# Patient Record
Sex: Female | Born: 1955 | Race: White | Hispanic: No | Marital: Married
Health system: Southern US, Community
[De-identification: ages and names within clinical notes are randomized; demographics above are authoritative.]

## PROBLEM LIST (undated history)

## (undated) DIAGNOSIS — L570 Actinic keratosis: Secondary | ICD-10-CM

## (undated) DIAGNOSIS — I10 Essential (primary) hypertension: Secondary | ICD-10-CM

## (undated) HISTORY — DX: Actinic keratosis: L57.0

---

## 2004-07-05 ENCOUNTER — Ambulatory Visit: Payer: Self-pay | Admitting: Unknown Physician Specialty

## 2005-05-20 ENCOUNTER — Ambulatory Visit: Payer: Self-pay | Admitting: Family Medicine

## 2005-06-18 ENCOUNTER — Ambulatory Visit: Payer: Self-pay | Admitting: Psychiatry

## 2005-07-18 ENCOUNTER — Ambulatory Visit: Payer: Self-pay | Admitting: Unknown Physician Specialty

## 2006-03-20 DIAGNOSIS — Z86018 Personal history of other benign neoplasm: Secondary | ICD-10-CM

## 2006-03-20 HISTORY — DX: Personal history of other benign neoplasm: Z86.018

## 2006-07-22 ENCOUNTER — Ambulatory Visit: Payer: Self-pay | Admitting: Unknown Physician Specialty

## 2007-07-27 ENCOUNTER — Ambulatory Visit: Payer: Self-pay | Admitting: Unknown Physician Specialty

## 2007-07-29 ENCOUNTER — Ambulatory Visit: Payer: Self-pay | Admitting: Unknown Physician Specialty

## 2008-08-09 ENCOUNTER — Ambulatory Visit: Payer: Self-pay | Admitting: Unknown Physician Specialty

## 2009-08-15 ENCOUNTER — Ambulatory Visit: Payer: Self-pay | Admitting: Unknown Physician Specialty

## 2010-09-20 ENCOUNTER — Ambulatory Visit: Payer: Self-pay | Admitting: Unknown Physician Specialty

## 2010-10-11 ENCOUNTER — Ambulatory Visit: Payer: Self-pay | Admitting: Unknown Physician Specialty

## 2011-12-03 ENCOUNTER — Ambulatory Visit: Payer: Self-pay | Admitting: Obstetrics and Gynecology

## 2013-01-04 ENCOUNTER — Ambulatory Visit: Payer: Self-pay | Admitting: Obstetrics and Gynecology

## 2013-01-18 ENCOUNTER — Ambulatory Visit: Payer: Self-pay | Admitting: Gastroenterology

## 2013-05-27 ENCOUNTER — Ambulatory Visit: Payer: Self-pay | Admitting: Obstetrics and Gynecology

## 2013-05-27 LAB — BASIC METABOLIC PANEL
Anion Gap: 3 — ABNORMAL LOW (ref 7–16)
BUN: 11 mg/dL (ref 7–18)
CALCIUM: 9.7 mg/dL (ref 8.5–10.1)
CHLORIDE: 102 mmol/L (ref 98–107)
Co2: 32 mmol/L (ref 21–32)
Creatinine: 0.7 mg/dL (ref 0.60–1.30)
EGFR (Non-African Amer.): >60
Glucose: 92 mg/dL (ref 65–99)
Osmolality: 273 (ref 275–301)
Potassium: 3.8 mmol/L (ref 3.5–5.1)
SODIUM: 137 mmol/L (ref 136–145)

## 2013-06-04 ENCOUNTER — Ambulatory Visit: Payer: Self-pay | Admitting: Obstetrics and Gynecology

## 2013-06-07 LAB — PATHOLOGY REPORT

## 2014-03-31 ENCOUNTER — Ambulatory Visit: Payer: Self-pay | Admitting: Obstetrics and Gynecology

## 2014-08-06 NOTE — Op Note (Signed)
PATIENT NAME:  Danielle Wang, Danielle Wang MR#:  [id number] DATE OF BIRTH:  1955-07-07  DATE OF PROCEDURE:  06/04/2013  PREOPERATIVE DIAGNOSIS: Endometrial polyp with severe bleeding.   POSTOPERATIVE DIAGNOSIS: Endometrial polyp with severe bleeding.   PROCEDURE: Hysteroscopy, dilatation and curettage, polypectomy.  ESTIMATED BLOOD LOSS: 25 mL.   FINDINGS: Very thickened endometrium with polyp outside the left tubal ostia, broad base, with a fluffy polyp on a stalk anteriorly.   SURGEON: Delsa Sale, MD  DESCRIPTION OF PROCEDURE: The patient was taken to the operating room and placed in supine position. After adequate general endotracheal anesthesia was instilled, the patient was prepped and draped in the usual sterile fashion. A side-opening speculum was placed in the patient's vagina, and the anterior lip of the cervix was grasped with a single-tooth tenaculum. The uterus was sounded and dilated to accommodate the MyoSure. The camera was placed inside, and the aforementioned findings were seen. Photographs were taken. The MyoSure was then placed into the cavity, and the polyps were removed with intermittent release of the MyoSure. D and C was then performed. Afterwards, good hemostasis was identified. The MyoSure was then removed, and the uterus was allowed to drain. The single-tooth tenaculum was removed from the anterior lip of the cervix. The side-opening speculum was removed. Good hemostasis was identified, and the patient was taken to recovery after having tolerated the procedure well.   ____________________________ Delsa Sale, MD cck:lb D: 06/12/2013 05:25:49 ET T: 06/12/2013 05:53:24 ET JOB#: [id number]  cc: Delsa Sale, MD, <Dictator> Delsa Sale MD ELECTRONICALLY SIGNED 06/14/2013 11:26

## 2015-04-13 ENCOUNTER — Other Ambulatory Visit: Payer: Self-pay | Admitting: Obstetrics and Gynecology

## 2015-04-13 DIAGNOSIS — Z1231 Encounter for screening mammogram for malignant neoplasm of breast: Secondary | ICD-10-CM

## 2015-04-21 ENCOUNTER — Ambulatory Visit
Admission: RE | Admit: 2015-04-21 | Discharge: 2015-04-21 | Disposition: A | Discharge status: Home / Self Care | Payer: 59 | Source: Ambulatory Visit | Attending: Obstetrics and Gynecology | Admitting: Obstetrics and Gynecology

## 2015-04-21 DIAGNOSIS — Z1231 Encounter for screening mammogram for malignant neoplasm of breast: Secondary | ICD-10-CM | POA: Diagnosis not present

## 2015-06-19 ENCOUNTER — Other Ambulatory Visit: Payer: Self-pay | Admitting: Family Medicine

## 2015-06-19 DIAGNOSIS — R109 Unspecified abdominal pain: Secondary | ICD-10-CM

## 2015-06-27 ENCOUNTER — Ambulatory Visit
Admission: RE | Admit: 2015-06-27 | Discharge: 2015-06-27 | Disposition: A | Discharge status: Home / Self Care | Payer: 59 | Source: Ambulatory Visit | Attending: Family Medicine | Admitting: Family Medicine

## 2015-06-27 DIAGNOSIS — I7090 Unspecified atherosclerosis: Secondary | ICD-10-CM | POA: Insufficient documentation

## 2015-06-27 DIAGNOSIS — K8020 Calculus of gallbladder without cholecystitis without obstruction: Secondary | ICD-10-CM | POA: Insufficient documentation

## 2015-06-27 DIAGNOSIS — K5730 Diverticulosis of large intestine without perforation or abscess without bleeding: Secondary | ICD-10-CM | POA: Diagnosis not present

## 2015-06-27 DIAGNOSIS — R109 Unspecified abdominal pain: Secondary | ICD-10-CM

## 2015-06-27 HISTORY — DX: Essential (primary) hypertension: I10

## 2015-06-27 MED ORDER — IOHEXOL 300 MG/ML  SOLN
100.0000 mL | Freq: Once | INTRAMUSCULAR | Status: AC | PRN
  Administered 2015-06-27: 100 mL via INTRAVENOUS

## 2016-03-25 ENCOUNTER — Other Ambulatory Visit: Payer: Self-pay | Admitting: Obstetrics & Gynecology

## 2016-03-25 DIAGNOSIS — Z1231 Encounter for screening mammogram for malignant neoplasm of breast: Secondary | ICD-10-CM

## 2016-04-24 ENCOUNTER — Ambulatory Visit
Admission: RE | Admit: 2016-04-24 | Discharge: 2016-04-24 | Disposition: A | Discharge status: Home / Self Care | Payer: 59 | Source: Ambulatory Visit | Attending: Obstetrics & Gynecology | Admitting: Obstetrics & Gynecology

## 2016-04-24 DIAGNOSIS — Z1231 Encounter for screening mammogram for malignant neoplasm of breast: Secondary | ICD-10-CM | POA: Diagnosis present

## 2017-06-12 ENCOUNTER — Other Ambulatory Visit: Payer: Self-pay | Admitting: Obstetrics & Gynecology

## 2017-06-12 DIAGNOSIS — Z1231 Encounter for screening mammogram for malignant neoplasm of breast: Secondary | ICD-10-CM

## 2017-07-02 ENCOUNTER — Ambulatory Visit
Admission: RE | Admit: 2017-07-02 | Discharge: 2017-07-02 | Disposition: A | Discharge status: Home / Self Care | Payer: Managed Care, Other (non HMO) | Source: Ambulatory Visit | Attending: Obstetrics & Gynecology | Admitting: Obstetrics & Gynecology

## 2017-07-02 DIAGNOSIS — Z1231 Encounter for screening mammogram for malignant neoplasm of breast: Secondary | ICD-10-CM | POA: Diagnosis present

## 2018-06-25 ENCOUNTER — Other Ambulatory Visit: Payer: Self-pay | Admitting: Obstetrics & Gynecology

## 2018-06-25 DIAGNOSIS — Z1231 Encounter for screening mammogram for malignant neoplasm of breast: Secondary | ICD-10-CM

## 2018-09-30 ENCOUNTER — Ambulatory Visit
Admission: RE | Admit: 2018-09-30 | Discharge: 2018-09-30 | Disposition: A | Discharge status: Home / Self Care | Payer: Managed Care, Other (non HMO) | Source: Ambulatory Visit | Attending: Obstetrics & Gynecology | Admitting: Obstetrics & Gynecology

## 2018-09-30 ENCOUNTER — Other Ambulatory Visit: Payer: Self-pay

## 2018-09-30 DIAGNOSIS — Z1231 Encounter for screening mammogram for malignant neoplasm of breast: Secondary | ICD-10-CM | POA: Diagnosis not present

## 2019-07-06 ENCOUNTER — Other Ambulatory Visit: Payer: Self-pay | Admitting: Obstetrics & Gynecology

## 2019-07-06 DIAGNOSIS — Z1231 Encounter for screening mammogram for malignant neoplasm of breast: Secondary | ICD-10-CM

## 2019-07-23 ENCOUNTER — Ambulatory Visit: Payer: Managed Care, Other (non HMO) | Attending: Internal Medicine

## 2019-07-23 ENCOUNTER — Other Ambulatory Visit: Payer: Self-pay

## 2019-07-23 DIAGNOSIS — Z23 Encounter for immunization: Secondary | ICD-10-CM

## 2019-07-23 NOTE — Progress Notes (Signed)
   Covid-19 Vaccination Clinic  Name:  Danielle Wang    MRN: [place]:[id number] DOB: October 20, 1955  07/23/2019  [name] was observed post Covid-19 immunization for 15 minutes without incident. She was provided with Vaccine Information Sheet and instruction to access the V-Safe system.   [name] was instructed to call 911 with any severe reactions post vaccine: Marland Kitchen Difficulty breathing  . Swelling of face and throat  . A fast heartbeat  . A bad rash all over body  . Dizziness and weakness   Immunizations Administered    Name Date Dose VIS Date Route   Pfizer COVID-19 Vaccine 07/23/2019  9:35 AM 0.3 mL 03/26/2019 Intramuscular   Manufacturer: [place]   Lot: [id number]   [place]: [id number]

## 2019-08-18 ENCOUNTER — Other Ambulatory Visit: Payer: Self-pay

## 2019-08-18 ENCOUNTER — Ambulatory Visit: Payer: Managed Care, Other (non HMO) | Attending: Internal Medicine

## 2019-08-18 DIAGNOSIS — Z23 Encounter for immunization: Secondary | ICD-10-CM

## 2019-08-18 NOTE — Progress Notes (Signed)
   Covid-19 Vaccination Clinic  Name:  JAYDIE ZIEGENHAGEN    MRN: [place]:[id number] DOB: 1956-01-01  08/18/2019  Ms. Nelms was observed post Covid-19 immunization for 15 minutes without incident. She was provided with Vaccine Information Sheet and instruction to access the V-Safe system.   Ms. Elizardo was instructed to call 911 with any severe reactions post vaccine: Marland Kitchen Difficulty breathing  . Swelling of face and throat  . A fast heartbeat  . A bad rash all over body  . Dizziness and weakness   Immunizations Administered    Name Date Dose VIS Date Route   Pfizer COVID-19 Vaccine 08/18/2019  8:47 AM 0.3 mL 06/09/2018 Intramuscular   Manufacturer: [place]   Lot: [id number]   [place]: [id number]

## 2019-10-05 ENCOUNTER — Ambulatory Visit
Admission: RE | Admit: 2019-10-05 | Discharge: 2019-10-05 | Disposition: A | Discharge status: Home / Self Care | Payer: Managed Care, Other (non HMO) | Source: Ambulatory Visit | Attending: Obstetrics & Gynecology | Admitting: Obstetrics & Gynecology

## 2019-10-05 DIAGNOSIS — Z1231 Encounter for screening mammogram for malignant neoplasm of breast: Secondary | ICD-10-CM | POA: Insufficient documentation

## 2019-11-10 ENCOUNTER — Ambulatory Visit: Payer: Managed Care, Other (non HMO) | Admitting: Dermatology

## 2019-11-10 ENCOUNTER — Encounter: Payer: Self-pay | Admitting: Dermatology

## 2019-11-10 ENCOUNTER — Other Ambulatory Visit: Payer: Self-pay | Admitting: Dermatology

## 2019-11-10 ENCOUNTER — Other Ambulatory Visit: Payer: Self-pay

## 2019-11-10 DIAGNOSIS — L820 Inflamed seborrheic keratosis: Secondary | ICD-10-CM

## 2019-11-10 DIAGNOSIS — L578 Other skin changes due to chronic exposure to nonionizing radiation: Secondary | ICD-10-CM

## 2019-11-10 DIAGNOSIS — Z86018 Personal history of other benign neoplasm: Secondary | ICD-10-CM

## 2019-11-10 DIAGNOSIS — L821 Other seborrheic keratosis: Secondary | ICD-10-CM

## 2019-11-10 DIAGNOSIS — Z1283 Encounter for screening for malignant neoplasm of skin: Secondary | ICD-10-CM

## 2019-11-10 DIAGNOSIS — D492 Neoplasm of unspecified behavior of bone, soft tissue, and skin: Secondary | ICD-10-CM

## 2019-11-10 DIAGNOSIS — L814 Other melanin hyperpigmentation: Secondary | ICD-10-CM | POA: Diagnosis not present

## 2019-11-10 DIAGNOSIS — D485 Neoplasm of uncertain behavior of skin: Secondary | ICD-10-CM | POA: Diagnosis not present

## 2019-11-10 DIAGNOSIS — D229 Melanocytic nevi, unspecified: Secondary | ICD-10-CM

## 2019-11-10 DIAGNOSIS — I781 Nevus, non-neoplastic: Secondary | ICD-10-CM

## 2019-11-10 DIAGNOSIS — D1800 Hemangioma unspecified site: Secondary | ICD-10-CM

## 2019-11-10 HISTORY — DX: Personal history of other benign neoplasm: Z86.018

## 2019-11-10 NOTE — Patient Instructions (Signed)

## 2019-11-10 NOTE — Progress Notes (Signed)
Follow-Up Visit   Subjective  Danielle Wang is a 64 y.o. female who presents for the following: Annual Exam (Total body skin exam, hx of dysplastic nevus R lat braline 2007) and growths irritating (neck, back). The patient presents for Total-Body Skin Exam (TBSE) for skin cancer screening and mole check.  The following portions of the chart were reviewed this encounter and updated as appropriate:  Allergies  Meds  Problems  Med Hx  Surg Hx  Fam Hx     Review of Systems:  No other skin or systemic complaints except as noted in HPI or Assessment and Plan.  Objective  Well appearing patient in no apparent distress; mood and affect are within normal limits.  A full examination was performed including scalp, head, eyes, ears, nose, lips, neck, chest, axillae, abdomen, back, buttocks, bilateral upper extremities, bilateral lower extremities, hands, feet, fingers, toes, fingernails, and toenails. All findings within normal limits unless otherwise noted below.  Objective  Right lateral braline: Scar with no evidence of recurrence.   Objective  Left scapula: 0.7cm irregular brown macule  Objective  Left sup breast: 0.6cm irregular brown macule  Objective  Right medial breast: 0.6cm irregular brown macule  Objective  Neck, back x 8 (8): Erythematous keratotic or waxy stuck-on papule or plaque.    Assessment & Plan    Telangiectasia/Rosacea - Dilated blood vessel - Benign appearing on exam - Call for changes  Lentigines - Scattered tan macules - Discussed due to sun exposure - Benign, observe - Call for any changes - Discussed BBL, pt may schedule for treatment  Seborrheic Keratoses - Stuck-on, waxy, tan-brown papules and plaques  - Discussed benign etiology and prognosis. - Observe - Call for any changes  Melanocytic Nevi - Tan-brown and/or pink-flesh-colored symmetric macules and papules - Benign appearing on exam today - Observation - Call clinic for new  or changing moles - Recommend daily use of broad spectrum spf 30+ sunscreen to sun-exposed areas.   Hemangiomas - Red papules - Discussed benign nature - Observe - Call for any changes  Actinic Damage - diffuse scaly erythematous macules with underlying dyspigmentation - Recommend daily broad spectrum sunscreen SPF 30+ to sun-exposed areas, reapply every 2 hours as needed.  - Call for new or changing lesions.  Skin cancer screening performed today.   History of dysplastic nevus Right lateral braline  Clear, observe for changes   Neoplasm of skin (3) Left scapula  Epidermal / dermal shaving  Lesion diameter (cm):  0.7 Informed consent: discussed and consent obtained   Timeout: patient name, date of birth, surgical site, and procedure verified   Procedure prep:  Patient was prepped and draped in usual sterile fashion Prep type:  Isopropyl alcohol Anesthesia: the lesion was anesthetized in a standard fashion   Anesthetic:  1% lidocaine w/ epinephrine 1-100,000 buffered w/ 8.4% NaHCO3 Instrument used: flexible razor blade   Hemostasis achieved with: pressure, aluminum chloride and electrodesiccation   Outcome: patient tolerated procedure well   Post-procedure details: sterile dressing applied and wound care instructions given   Dressing type: bandage and petrolatum    Left sup breast  Epidermal / dermal shaving  Lesion diameter (cm):  0.6 Informed consent: discussed and consent obtained   Timeout: patient name, date of birth, surgical site, and procedure verified   Procedure prep:  Patient was prepped and draped in usual sterile fashion Prep type:  Isopropyl alcohol Anesthesia: the lesion was anesthetized in a standard fashion   Anesthetic:  1% lidocaine w/ epinephrine 1-100,000 buffered w/ 8.4% NaHCO3 Instrument used: flexible razor blade   Hemostasis achieved with: pressure, aluminum chloride and electrodesiccation   Outcome: patient tolerated procedure well     Post-procedure details: sterile dressing applied and wound care instructions given   Dressing type: bandage and petrolatum    Right medial breast  Epidermal / dermal shaving  Lesion diameter (cm):  0.6 Informed consent: discussed and consent obtained   Timeout: patient name, date of birth, surgical site, and procedure verified   Procedure prep:  Patient was prepped and draped in usual sterile fashion Prep type:  Isopropyl alcohol Anesthesia: the lesion was anesthetized in a standard fashion   Anesthetic:  1% lidocaine w/ epinephrine 1-100,000 buffered w/ 8.4% NaHCO3 Instrument used: flexible razor blade   Hemostasis achieved with: pressure, aluminum chloride and electrodesiccation   Outcome: patient tolerated procedure well   Post-procedure details: sterile dressing applied and wound care instructions given   Dressing type: bandage and petrolatum    Other Related Procedures Anatomic Pathology Report  Inflamed seborrheic keratosis (8) Neck, back x 8  Destruction of lesion - Neck, back x 8 Complexity: simple   Destruction method: cryotherapy   Informed consent: discussed and consent obtained   Timeout:  patient name, date of birth, surgical site, and procedure verified Lesion destroyed using liquid nitrogen: Yes   Region frozen until ice ball extended beyond lesion: Yes   Outcome: patient tolerated procedure well with no complications   Post-procedure details: wound care instructions given    Skin cancer screening  Return in about 1 year (around 11/09/2020) for TBSE, hx of Dysplastic Nevus.   I, Othelia Pulling, RMA, am acting as scribe for Sarina Ser, MD .  Documentation: I have reviewed the above documentation for accuracy and completeness, and I agree with the above.  Sarina Ser, MD

## 2019-11-14 ENCOUNTER — Encounter: Payer: Self-pay | Admitting: Dermatology

## 2019-11-19 LAB — ANATOMIC PATHOLOGY REPORT

## 2019-11-23 ENCOUNTER — Telehealth: Payer: Self-pay

## 2019-11-23 NOTE — Telephone Encounter (Signed)
Discussed biopsy results with pt  °

## 2020-01-24 ENCOUNTER — Ambulatory Visit: Payer: Managed Care, Other (non HMO)

## 2020-03-06 ENCOUNTER — Ambulatory Visit (INDEPENDENT_AMBULATORY_CARE_PROVIDER_SITE_OTHER): Payer: Self-pay

## 2020-03-06 ENCOUNTER — Other Ambulatory Visit: Payer: Self-pay

## 2020-03-06 DIAGNOSIS — L578 Other skin changes due to chronic exposure to nonionizing radiation: Secondary | ICD-10-CM

## 2020-03-06 DIAGNOSIS — I781 Nevus, non-neoplastic: Secondary | ICD-10-CM

## 2020-03-06 DIAGNOSIS — L719 Rosacea, unspecified: Secondary | ICD-10-CM

## 2020-10-02 ENCOUNTER — Other Ambulatory Visit: Payer: Self-pay | Admitting: Family Medicine

## 2020-10-05 ENCOUNTER — Other Ambulatory Visit: Payer: Self-pay | Admitting: Family Medicine

## 2020-10-05 DIAGNOSIS — Z1231 Encounter for screening mammogram for malignant neoplasm of breast: Secondary | ICD-10-CM

## 2020-10-13 ENCOUNTER — Other Ambulatory Visit: Payer: Self-pay

## 2020-10-13 ENCOUNTER — Ambulatory Visit
Admission: RE | Admit: 2020-10-13 | Discharge: 2020-10-13 | Disposition: A | Discharge status: Home / Self Care | Payer: Managed Care, Other (non HMO) | Source: Ambulatory Visit | Attending: Family Medicine | Admitting: Family Medicine

## 2020-10-13 DIAGNOSIS — Z1231 Encounter for screening mammogram for malignant neoplasm of breast: Secondary | ICD-10-CM | POA: Diagnosis not present

## 2020-11-22 ENCOUNTER — Other Ambulatory Visit: Payer: Self-pay

## 2020-11-22 ENCOUNTER — Encounter: Payer: Self-pay | Admitting: Dermatology

## 2020-11-22 ENCOUNTER — Ambulatory Visit: Payer: Managed Care, Other (non HMO) | Admitting: Dermatology

## 2020-11-22 DIAGNOSIS — L720 Epidermal cyst: Secondary | ICD-10-CM

## 2020-11-22 DIAGNOSIS — Z1283 Encounter for screening for malignant neoplasm of skin: Secondary | ICD-10-CM

## 2020-11-22 DIAGNOSIS — L814 Other melanin hyperpigmentation: Secondary | ICD-10-CM | POA: Diagnosis not present

## 2020-11-22 DIAGNOSIS — Z86018 Personal history of other benign neoplasm: Secondary | ICD-10-CM

## 2020-11-22 DIAGNOSIS — L820 Inflamed seborrheic keratosis: Secondary | ICD-10-CM | POA: Diagnosis not present

## 2020-11-22 DIAGNOSIS — L578 Other skin changes due to chronic exposure to nonionizing radiation: Secondary | ICD-10-CM

## 2020-11-22 DIAGNOSIS — L821 Other seborrheic keratosis: Secondary | ICD-10-CM

## 2020-11-22 DIAGNOSIS — D692 Other nonthrombocytopenic purpura: Secondary | ICD-10-CM

## 2020-11-22 DIAGNOSIS — D1800 Hemangioma unspecified site: Secondary | ICD-10-CM

## 2020-11-22 DIAGNOSIS — D229 Melanocytic nevi, unspecified: Secondary | ICD-10-CM

## 2020-11-22 NOTE — Patient Instructions (Addendum)

## 2020-11-22 NOTE — Progress Notes (Signed)
Follow-Up Visit   Subjective  Danielle Wang is a 65 y.o. female who presents for the following: Total body skin exam (Hx of dysplastic nevus R lat braline, L scapula, L sup breast) and check spots (L shoulder, irritating). The patient presents for Total-Body Skin Exam (TBSE) for skin cancer screening and mole check. She has several rough areas on her trunk she would like evaluated and treated if possible as they are irritating and itchy.  She has a similar lesion on left upper eyelid margin.  The following portions of the chart were reviewed this encounter and updated as appropriate:   Allergies  Meds  Problems  Med Hx  Surg Hx  Fam Hx     Review of Systems:  No other skin or systemic complaints except as noted in HPI or Assessment and Plan.  Objective  Well appearing patient in no apparent distress; mood and affect are within normal limits.  A full examination was performed including scalp, head, eyes, ears, nose, lips, neck, chest, axillae, abdomen, back, buttocks, bilateral upper extremities, bilateral lower extremities, hands, feet, fingers, toes, fingernails, and toenails. All findings within normal limits unless otherwise noted below.  R lat braline, L scapula, L sup breast Scars with no evidence of recurrence.   L shoulder x 5, Back x 8, L neck x 2, L supra clavicular  x3, forehead x 1, L cheek x 2, Totl = 21 (21), Left Upper Eyelid Margin x 1, Total = 1 Erythematous keratotic or waxy stuck-on papule or plaque.   Left Inframammary Flesh colored pap   Assessment & Plan   Lentigines - Scattered tan macules - Due to sun exposure - Benign-appering, observe - Recommend daily broad spectrum sunscreen SPF 30+ to sun-exposed areas, reapply every 2 hours as needed. - Call for any changes  Seborrheic Keratoses - Stuck-on, waxy, tan-brown papules and/or plaques  - Benign-appearing - Discussed benign etiology and prognosis. - Observe - Call for any changes  Melanocytic  Nevi - Tan-brown and/or pink-flesh-colored symmetric macules and papules - Benign appearing on exam today - Observation - Call clinic for new or changing moles - Recommend daily use of broad spectrum spf 30+ sunscreen to sun-exposed areas.   Hemangiomas - Red papules - Discussed benign nature - Observe - Call for any changes  Actinic Damage - Chronic condition, secondary to cumulative UV/sun exposure - diffuse scaly erythematous macules with underlying dyspigmentation - Recommend daily broad spectrum sunscreen SPF 30+ to sun-exposed areas, reapply every 2 hours as needed.  - Staying in the shade or wearing long sleeves, sun glasses (UVA+UVB protection) and wide brim hats (4-inch brim around the entire circumference of the hat) are also recommended for sun protection.  - Call for new or changing lesions.  Skin cancer screening performed today.  Purpura - Chronic; persistent and recurrent.  Treatable, but not curable. - Violaceous macules and patches - Benign - Related to trauma, age, sun damage and/or use of blood thinners, chronic use of topical and/or oral steroids - Observe - Can use OTC arnica containing moisturizer such as Dermend Bruise Formula if desired - Call for worsening or other concerns  History of dysplastic nevus R lat braline, L scapula, L sup breast  Clear. Observe for recurrence. Call clinic for new or changing lesions.  Recommend regular skin exams, daily broad-spectrum spf 30+ sunscreen use, and photoprotection.    Inflamed seborrheic keratosis L shoulder x 5, Back x 8, L neck x 2, L supra clavicular  x3,  forehead x 1, L cheek x 2, Totl = 21 (21); Left Upper Eyelid Margin x 1, Total = 1  Destruction of lesion - L shoulder x 5, Back x 8, L neck x 2, L supra clavicular  x3, forehead x 1, L cheek x 2, Totl = 21 Complexity: simple   Destruction method: cryotherapy   Informed consent: discussed and consent obtained   Timeout:  patient name, date of birth,  surgical site, and procedure verified Lesion destroyed using liquid nitrogen: Yes   Region frozen until ice ball extended beyond lesion: Yes   Outcome: patient tolerated procedure well with no complications   Post-procedure details: wound care instructions given    Epidermal cyst Left Inframammary  Cyst vs Nevus,  Benign appearing, observe  Skin cancer screening  Return in about 1 year (around 11/22/2021) for TBSE, Hx of Dysplastic nevi.  I, Othelia Pulling, RMA, am acting as scribe for Sarina Ser, MD . Documentation: I have reviewed the above documentation for accuracy and completeness, and I agree with the above.  Sarina Ser, MD

## 2020-11-28 ENCOUNTER — Encounter: Payer: Self-pay | Admitting: Dermatology

## 2021-03-01 ENCOUNTER — Ambulatory Visit: Payer: Managed Care, Other (non HMO) | Admitting: Dermatology

## 2021-03-01 ENCOUNTER — Other Ambulatory Visit: Payer: Self-pay

## 2021-03-01 DIAGNOSIS — L820 Inflamed seborrheic keratosis: Secondary | ICD-10-CM | POA: Diagnosis not present

## 2021-03-01 DIAGNOSIS — Z85828 Personal history of other malignant neoplasm of skin: Secondary | ICD-10-CM

## 2021-03-01 DIAGNOSIS — L821 Other seborrheic keratosis: Secondary | ICD-10-CM | POA: Diagnosis not present

## 2021-03-01 NOTE — Patient Instructions (Addendum)
If You Need Anything After Your Visit  If you have any questions or concerns for your doctor, please call our main line at [phone] and press option 4 to reach your doctor's medical assistant. If no one answers, please leave a voicemail as directed and we will return your call as soon as possible. Messages left after 4 pm will be answered the following business day.   You may also send us a message via MyChart. We typically respond to MyChart messages within 1-2 business days.  For prescription refills, please ask your pharmacy to contact our office. Our fax number is [phone].  If you have an urgent issue when the clinic is closed that cannot wait until the next business day, you can page your doctor at the number below.    Please note that while we do our best to be available for urgent issues outside of office hours, we are not available 24/7.   If you have an urgent issue and are unable to reach us, you may choose to seek medical care at your doctor's office, retail clinic, urgent care center, or emergency room.  If you have a medical emergency, please immediately call 911 or go to the emergency department.  Pager Numbers  - Dr. Kowalski: [phone]  - Dr. Moye: [phone]  - Dr. Stewart: [phone]  In the event of inclement weather, please call our main line at [phone] for an update on the status of any delays or closures.  Dermatology Medication Tips: Please keep the boxes that topical medications come in in order to help keep track of the instructions about where and how to use these. Pharmacies typically print the medication instructions only on the boxes and not directly on the medication tubes.   If your medication is too expensive, please contact our office at [phone] option 4 or send us a message through MyChart.   We are unable to tell what your co-pay for medications will be in advance as this is different depending on your insurance coverage.  However, we may be able to find a substitute medication at lower cost or fill out paperwork to get insurance to cover a needed medication.   If a prior authorization is required to get your medication covered by your insurance company, please allow us 1-2 business days to complete this process.  Drug prices often vary depending on where the prescription is filled and some pharmacies may offer cheaper prices.  The website www.goodrx.com contains coupons for medications through different pharmacies. The prices here do not account for what the cost may be with help from insurance (it may be cheaper with your insurance), but the website can give you the price if you did not use any insurance.  - You can print the associated coupon and take it with your prescription to the pharmacy.  - You may also stop by our office during regular business hours and pick up a GoodRx coupon card.  - If you need your prescription sent electronically to a different pharmacy, notify our office through  MyChart or by phone at [phone] option 4.     Si Usted Necesita Algo Despus de Su Visita  Tambin puede enviarnos un mensaje a travs de MyChart. Por lo general respondemos a los mensajes de MyChart en el transcurso de 1 a 2 das hbiles.  Para renovar recetas, por favor pida a su farmacia que se ponga en contacto con nuestra oficina. Nuestro nmero de fax es el [phone].  Si tiene   un asunto urgente cuando la clnica est cerrada y que no puede esperar hasta el siguiente da hbil, puede llamar/localizar a su doctor(a) al nmero que aparece a continuacin.   Por favor, tenga en cuenta que aunque hacemos todo lo posible para estar disponibles para asuntos urgentes fuera del horario de oficina, no estamos disponibles las 24 horas del da, los 7 das de la semana.   Si tiene un problema urgente y no puede comunicarse con nosotros, puede optar por buscar atencin mdica  en el consultorio de su  doctor(a), en una clnica privada, en un centro de atencin urgente o en una sala de emergencias.  Si tiene una emergencia mdica, por favor llame inmediatamente al 911 o vaya a la sala de emergencias.  Nmeros de bper  - Dr. Kowalski: [phone]  - Dra. Moye: [phone]  - Dra. Stewart: [phone]  En caso de inclemencias del tiempo, por favor llame a nuestra lnea principal al [phone] para una actualizacin sobre el estado de cualquier retraso o cierre.  Consejos para la medicacin en dermatologa: Por favor, guarde las cajas en las que vienen los medicamentos de uso tpico para ayudarle a seguir las instrucciones sobre dnde y cmo usarlos. Las farmacias generalmente imprimen las instrucciones del medicamento slo en las cajas y no directamente en los tubos del medicamento.   Si su medicamento es muy caro, por favor, pngase en contacto con nuestra oficina llamando al [phone] y presione la opcin 4 o envenos un mensaje a travs de MyChart.   No podemos decirle cul ser su copago por los medicamentos por adelantado ya que esto es diferente dependiendo de la cobertura de su seguro. Sin embargo, es posible que podamos encontrar un medicamento sustituto a menor costo o llenar un formulario para que el seguro cubra el medicamento que se considera necesario.   Si se requiere una autorizacin previa para que su compaa de seguros cubra su medicamento, por favor permtanos de 1 a 2 das hbiles para completar este proceso.  Los precios de los medicamentos varan con frecuencia dependiendo del lugar de dnde se surte la receta y alguna farmacias pueden ofrecer precios ms baratos.  El sitio web www.goodrx.com tiene cupones para medicamentos de diferentes farmacias. Los precios aqu no tienen en cuenta lo que podra costar con la ayuda del seguro (puede ser ms barato con su seguro), pero el sitio web puede darle el precio si no utiliz ningn seguro.  - Puede imprimir el cupn  correspondiente y llevarlo con su receta a la farmacia.  - Tambin puede pasar por nuestra oficina durante el horario de atencin regular y recoger una tarjeta de cupones de GoodRx.  - Si necesita que su receta se enve electrnicamente a una farmacia diferente, informe a nuestra oficina a travs de MyChart de [place] o por telfono llamando al [phone] y presione la opcin 4.   Cryotherapy Aftercare  Wash gently with soap and water everyday.   Apply Vaseline and Band-Aid daily until healed.  

## 2021-03-01 NOTE — Progress Notes (Signed)
   Follow-Up Visit   Subjective  Danielle Wang is a 65 y.o. female who presents for the following: check spots (Face, chest, few months, itchy and pt picks at) and Pruritis (Back, off and on). The patient has spots, moles and lesions to be evaluated, some may be new or changing and the patient has concerns that these could be cancer.  The following portions of the chart were reviewed this encounter and updated as appropriate:   Allergies  Meds  Problems  Med Hx  Surg Hx  Fam Hx     Review of Systems:  No other skin or systemic complaints except as noted in HPI or Assessment and Plan.  Objective  Well appearing patient in no apparent distress; mood and affect are within normal limits.  A focused examination was performed including face, chest. Relevant physical exam findings are noted in the Assessment and Plan.  ear, face, neck, chest x 17, Total = 17 (17) Erythematous keratotic or waxy stuck-on papule or plaque.    Assessment & Plan   History of Basal Cell Carcinoma of the Skin - No evidence of recurrence today - Recommend regular full body skin exams - Recommend daily broad spectrum sunscreen SPF 30+ to sun-exposed areas, reapply every 2 hours as needed.  - Call if any new or changing lesions are noted between office visits - chest  Seborrheic Keratoses - Stuck-on, waxy, tan-brown papules and/or plaques  - Benign-appearing - Discussed benign etiology and prognosis. - Observe - Call for any changes - chest, back  Inflamed seborrheic keratosis ear, face, neck, chest x 17, Total = 17  Destruction of lesion - ear, face, neck, chest x 17, Total = 17 Complexity: simple   Destruction method: cryotherapy   Informed consent: discussed and consent obtained   Timeout:  patient name, date of birth, surgical site, and procedure verified Lesion destroyed using liquid nitrogen: Yes   Region frozen until ice ball extended beyond lesion: Yes   Outcome: patient tolerated procedure  well with no complications   Post-procedure details: wound care instructions given    Return for as scheduled for TBSE.  I, Othelia Pulling, RMA, am acting as scribe for Sarina Ser, MD . Documentation: I have reviewed the above documentation for accuracy and completeness, and I agree with the above.  Sarina Ser, MD

## 2021-03-16 ENCOUNTER — Encounter: Payer: Self-pay | Admitting: Dermatology

## 2021-10-02 ENCOUNTER — Other Ambulatory Visit: Payer: Self-pay | Admitting: Family Medicine

## 2021-10-02 DIAGNOSIS — R1011 Right upper quadrant pain: Secondary | ICD-10-CM

## 2021-10-02 DIAGNOSIS — R197 Diarrhea, unspecified: Secondary | ICD-10-CM

## 2021-10-02 DIAGNOSIS — Z1231 Encounter for screening mammogram for malignant neoplasm of breast: Secondary | ICD-10-CM

## 2021-10-05 ENCOUNTER — Ambulatory Visit
Admission: RE | Admit: 2021-10-05 | Discharge: 2021-10-05 | Disposition: A | Discharge status: Home / Self Care | Payer: Managed Care, Other (non HMO) | Source: Ambulatory Visit | Attending: Family Medicine | Admitting: Family Medicine

## 2021-10-05 DIAGNOSIS — R1011 Right upper quadrant pain: Secondary | ICD-10-CM | POA: Diagnosis present

## 2021-10-05 DIAGNOSIS — R197 Diarrhea, unspecified: Secondary | ICD-10-CM | POA: Insufficient documentation

## 2021-10-26 ENCOUNTER — Ambulatory Visit
Admission: RE | Admit: 2021-10-26 | Discharge: 2021-10-26 | Disposition: A | Discharge status: Home / Self Care | Payer: Managed Care, Other (non HMO) | Source: Ambulatory Visit | Attending: Family Medicine | Admitting: Family Medicine

## 2021-10-26 DIAGNOSIS — Z1231 Encounter for screening mammogram for malignant neoplasm of breast: Secondary | ICD-10-CM | POA: Diagnosis present

## 2021-12-05 ENCOUNTER — Encounter: Payer: Managed Care, Other (non HMO) | Admitting: Dermatology

## 2021-12-26 ENCOUNTER — Encounter: Payer: Managed Care, Other (non HMO) | Admitting: Dermatology

## 2021-12-31 ENCOUNTER — Encounter: Payer: Managed Care, Other (non HMO) | Admitting: Dermatology

## 2022-01-07 ENCOUNTER — Ambulatory Visit: Payer: Managed Care, Other (non HMO) | Admitting: Dermatology

## 2022-01-07 DIAGNOSIS — D1801 Hemangioma of skin and subcutaneous tissue: Secondary | ICD-10-CM

## 2022-01-07 DIAGNOSIS — L578 Other skin changes due to chronic exposure to nonionizing radiation: Secondary | ICD-10-CM

## 2022-01-07 DIAGNOSIS — Z86018 Personal history of other benign neoplasm: Secondary | ICD-10-CM | POA: Diagnosis not present

## 2022-01-07 DIAGNOSIS — L820 Inflamed seborrheic keratosis: Secondary | ICD-10-CM

## 2022-01-07 DIAGNOSIS — L821 Other seborrheic keratosis: Secondary | ICD-10-CM

## 2022-01-07 DIAGNOSIS — Z1283 Encounter for screening for malignant neoplasm of skin: Secondary | ICD-10-CM | POA: Diagnosis not present

## 2022-01-07 DIAGNOSIS — L814 Other melanin hyperpigmentation: Secondary | ICD-10-CM

## 2022-01-07 DIAGNOSIS — D229 Melanocytic nevi, unspecified: Secondary | ICD-10-CM

## 2022-01-07 DIAGNOSIS — D239 Other benign neoplasm of skin, unspecified: Secondary | ICD-10-CM

## 2022-01-07 NOTE — Patient Instructions (Addendum)
Seborrheic Keratosis  What causes seborrheic keratoses? Seborrheic keratoses are harmless, common skin growths that first appear during adult life.  As time goes by, more growths appear.  Some people may develop a large number of them.  Seborrheic keratoses appear on both covered and uncovered body parts.  They are not caused by sunlight.  The tendency to develop seborrheic keratoses can be inherited.  They vary in color from skin-colored to gray, brown, or even black.  They can be either smooth or have a rough, warty surface.   Seborrheic keratoses are superficial and look as if they were stuck on the skin.  Under the microscope this type of keratosis looks like layers upon layers of skin.  That is why at times the top layer may seem to fall off, but the rest of the growth remains and re-grows.    Treatment Seborrheic keratoses do not need to be treated, but can easily be removed in the office.  Seborrheic keratoses often cause symptoms when they rub on clothing or jewelry.  Lesions can be in the way of shaving.  If they become inflamed, they can cause itching, soreness, or burning.  Removal of a seborrheic keratosis can be accomplished by freezing, burning, or surgery. If any spot bleeds, scabs, or grows rapidly, please return to have it checked, as these can be an indication of a skin cancer.  Cryotherapy Aftercare  Wash gently with soap and water everyday.   Apply Vaseline and Band-Aid daily until healed.       Melanoma ABCDEs  Melanoma is the most dangerous type of skin cancer, and is the leading cause of death from skin disease.  You are more likely to develop melanoma if you: Have light-colored skin, light-colored eyes, or red or blond hair Spend a lot of time in the sun Tan regularly, either outdoors or in a tanning bed Have had blistering sunburns, especially during childhood Have a close family member who has had a melanoma Have atypical moles or large birthmarks  Early  detection of melanoma is key since treatment is typically straightforward and cure rates are extremely high if we catch it early.   The first sign of melanoma is often a change in a mole or a new dark spot.  The ABCDE system is a way of remembering the signs of melanoma.  A for asymmetry:  The two halves do not match. B for border:  The edges of the growth are irregular. C for color:  A mixture of colors are present instead of an even brown color. D for diameter:  Melanomas are usually (but not always) greater than 6mm - the size of a pencil eraser. E for evolution:  The spot keeps changing in size, shape, and color.  Please check your skin once per month between visits. You can use a small mirror in front and a large mirror behind you to keep an eye on the back side or your body.   If you see any new or changing lesions before your next follow-up, please call to schedule a visit.  Please continue daily skin protection including broad spectrum sunscreen SPF 30+ to sun-exposed areas, reapplying every 2 hours as needed when you're outdoors.   Staying in the shade or wearing long sleeves, sun glasses (UVA+UVB protection) and wide brim hats (4-inch brim around the entire circumference of the hat) are also recommended for sun protection.     Due to recent changes in healthcare laws, you may see results of   your pathology and/or laboratory studies on MyChart before the doctors have had a chance to review them. We understand that in some cases there may be results that are confusing or concerning to you. Please understand that not all results are received at the same time and often the doctors may need to interpret multiple results in order to provide you with the best plan of care or course of treatment. Therefore, we ask that you please give us 2 business days to thoroughly review all your results before contacting the office for clarification. Should we see a critical lab result, you will be contacted  sooner.   If You Need Anything After Your Visit  If you have any questions or concerns for your doctor, please call our main line at [phone] and press option 4 to reach your doctor's medical assistant. If no one answers, please leave a voicemail as directed and we will return your call as soon as possible. Messages left after 4 pm will be answered the following business day.   You may also send us a message via MyChart. We typically respond to MyChart messages within 1-2 business days.  For prescription refills, please ask your pharmacy to contact our office. Our fax number is [phone].  If you have an urgent issue when the clinic is closed that cannot wait until the next business day, you can page your doctor at the number below.    Please note that while we do our best to be available for urgent issues outside of office hours, we are not available 24/7.   If you have an urgent issue and are unable to reach us, you may choose to seek medical care at your doctor's office, retail clinic, urgent care center, or emergency room.  If you have a medical emergency, please immediately call 911 or go to the emergency department.  Pager Numbers  - Dr. Kowalski: [phone]  - Dr. Moye: [phone]  - Dr. Stewart: [phone]  In the event of inclement weather, please call our main line at [phone] for an update on the status of any delays or closures.  Dermatology Medication Tips: Please keep the boxes that topical medications come in in order to help keep track of the instructions about where and how to use these. Pharmacies typically print the medication instructions only on the boxes and not directly on the medication tubes.   If your medication is too expensive, please contact our office at [phone] option 4 or send us a message through MyChart.   We are unable to tell what your co-pay for medications will be in advance as this is different depending on your insurance  coverage. However, we may be able to find a substitute medication at lower cost or fill out paperwork to get insurance to cover a needed medication.   If a prior authorization is required to get your medication covered by your insurance company, please allow us 1-2 business days to complete this process.  Drug prices often vary depending on where the prescription is filled and some pharmacies may offer cheaper prices.  The website www.goodrx.com contains coupons for medications through different pharmacies. The prices here do not account for what the cost may be with help from insurance (it may be cheaper with your insurance), but the website can give you the price if you did not use any insurance.  - You can print the associated coupon and take it with your prescription to the pharmacy.  - You may also stop   by our office during regular business hours and pick up a GoodRx coupon card.  - If you need your prescription sent electronically to a different pharmacy, notify our office through [facility] MyChart or by phone at [phone] option 4.     Si Usted Necesita Algo Despus de Su Visita  Tambin puede enviarnos un mensaje a travs de MyChart. Por lo general respondemos a los mensajes de MyChart en el transcurso de 1 a 2 das hbiles.  Para renovar recetas, por favor pida a su farmacia que se ponga en contacto con nuestra oficina. Nuestro nmero de fax es el [phone].  Si tiene un asunto urgente cuando la clnica est cerrada y que no puede esperar hasta el siguiente da hbil, puede llamar/localizar a su doctor(a) al nmero que aparece a continuacin.   Por favor, tenga en cuenta que aunque hacemos todo lo posible para estar disponibles para asuntos urgentes fuera del horario de oficina, no estamos disponibles las 24 horas del da, los 7 das de la semana.   Si tiene un problema urgente y no puede comunicarse con nosotros, puede optar por buscar atencin mdica  en el consultorio de  su doctor(a), en una clnica privada, en un centro de atencin urgente o en una sala de emergencias.  Si tiene una emergencia mdica, por favor llame inmediatamente al 911 o vaya a la sala de emergencias.  Nmeros de bper  - Dr. Kowalski: [phone]  - Dra. Moye: [phone]  - Dra. Stewart: [phone]  En caso de inclemencias del tiempo, por favor llame a nuestra lnea principal al [phone] para una actualizacin sobre el estado de cualquier retraso o cierre.  Consejos para la medicacin en dermatologa: Por favor, guarde las cajas en las que vienen los medicamentos de uso tpico para ayudarle a seguir las instrucciones sobre dnde y cmo usarlos. Las farmacias generalmente imprimen las instrucciones del medicamento slo en las cajas y no directamente en los tubos del medicamento.   Si su medicamento es muy caro, por favor, pngase en contacto con nuestra oficina llamando al [phone] y presione la opcin 4 o envenos un mensaje a travs de MyChart.   No podemos decirle cul ser su copago por los medicamentos por adelantado ya que esto es diferente dependiendo de la cobertura de su seguro. Sin embargo, es posible que podamos encontrar un medicamento sustituto a menor costo o llenar un formulario para que el seguro cubra el medicamento que se considera necesario.   Si se requiere una autorizacin previa para que su compaa de seguros cubra su medicamento, por favor permtanos de 1 a 2 das hbiles para completar este proceso.  Los precios de los medicamentos varan con frecuencia dependiendo del lugar de dnde se surte la receta y alguna farmacias pueden ofrecer precios ms baratos.  El sitio web www.goodrx.com tiene cupones para medicamentos de diferentes farmacias. Los precios aqu no tienen en cuenta lo que podra costar con la ayuda del seguro (puede ser ms barato con su seguro), pero el sitio web puede darle el precio si no utiliz ningn seguro.  - Puede imprimir el  cupn correspondiente y llevarlo con su receta a la farmacia.  - Tambin puede pasar por nuestra oficina durante el horario de atencin regular y recoger una tarjeta de cupones de GoodRx.  - Si necesita que su receta se enve electrnicamente a una farmacia diferente, informe a nuestra oficina a travs de MyChart de [place] o por telfono llamando al [phone] y presione la opcin 4.    4.  

## 2022-01-07 NOTE — Progress Notes (Unsigned)
Follow-Up Visit   Subjective  Danielle Wang is a 66 y.o. female who presents for the following: Annual Exam (1 year tbse, hx of isk and hx of dysplastic, spots at back, scalp, forehead. ). The patient presents for Total-Body Skin Exam (TBSE) for skin cancer screening and mole check.  The patient has spots, moles and lesions to be evaluated, some may be new or changing and the patient has concerns that these could be cancer.  The following portions of the chart were reviewed this encounter and updated as appropriate:  Allergies  Meds  Problems  Med Hx  Surg Hx  Fam Hx      Review of Systems: No other skin or systemic complaints except as noted in HPI or Assessment and Plan.  Objective  Well appearing patient in no apparent distress; mood and affect are within normal limits.  A full examination was performed including scalp, head, eyes, ears, nose, lips, neck, chest, axillae, abdomen, back, buttocks, bilateral upper extremities, bilateral lower extremities, hands, feet, fingers, toes, fingernails, and toenails. All findings within normal limits unless otherwise noted below.  Scalp x 1, left cheek x 2, left upper back spinal x 1, left scapula x 1, left back at bra line x 1 (6) Erythematous stuck-on, waxy papule or plaque   Assessment & Plan  Inflamed seborrheic keratosis (6) Scalp x 1, left cheek x 2, left upper back spinal x 1, left scapula x 1, left back at bra line x 1  Symptomatic, irritating, patient would like treated.  Destruction of lesion - Scalp x 1, left cheek x 2, left upper back spinal x 1, left scapula x 1, left back at bra line x 1 Complexity: simple   Destruction method: cryotherapy   Informed consent: discussed and consent obtained   Timeout:  patient name, date of birth, surgical site, and procedure verified Lesion destroyed using liquid nitrogen: Yes   Region frozen until ice ball extended beyond lesion: Yes   Outcome: patient tolerated procedure well with no  complications   Post-procedure details: wound care instructions given   Additional details:  Prior to procedure, discussed risks of blister formation, small wound, skin dyspigmentation, or rare scar following cryotherapy. Recommend Vaseline ointment to treated areas while healing.   Lentigines - Scattered tan macules - Due to sun exposure - Benign-appearing, observe - Recommend daily broad spectrum sunscreen SPF 30+ to sun-exposed areas, reapply every 2 hours as needed. - Call for any changes  Seborrheic Keratoses - Stuck-on, waxy, tan-brown papules and/or plaques  - Benign-appearing - Discussed benign etiology and prognosis. - Observe - Call for any changes  Melanocytic Nevi - Tan-brown and/or pink-flesh-colored symmetric macules and papules - Benign appearing on exam today - Observation - Call clinic for new or changing moles - Recommend daily use of broad spectrum spf 30+ sunscreen to sun-exposed areas.   Hemangiomas - Red papules - Discussed benign nature - Observe - Call for any changes  Actinic Damage - Chronic condition, secondary to cumulative UV/sun exposure - diffuse scaly erythematous macules with underlying dyspigmentation - Recommend daily broad spectrum sunscreen SPF 30+ to sun-exposed areas, reapply every 2 hours as needed.  - Staying in the shade or wearing long sleeves, sun glasses (UVA+UVB protection) and wide brim hats (4-inch brim around the entire circumference of the hat) are also recommended for sun protection.  - Call for new or changing lesions.  History of Dysplastic Nevi Multiple sites see history  R lat braline, L scapula,  L sup breast - No evidence of recurrence today - Recommend regular full body skin exams - Recommend daily broad spectrum sunscreen SPF 30+ to sun-exposed areas, reapply every 2 hours as needed.  - Call if any new or changing lesions are noted between office visits  Skin cancer screening performed today. Return in about 1  year (around 01/08/2023) for TBSE.  IRuthell Rummage, CMA, am acting as scribe for Sarina Ser, MD. Documentation: I have reviewed the above documentation for accuracy and completeness, and I agree with the above.  Sarina Ser, MD

## 2022-01-08 ENCOUNTER — Encounter: Payer: Self-pay | Admitting: Dermatology

## 2022-04-01 ENCOUNTER — Ambulatory Visit: Payer: Managed Care, Other (non HMO) | Admitting: Dermatology

## 2022-04-01 VITALS — BP 153/76 | HR 75

## 2022-04-01 DIAGNOSIS — L578 Other skin changes due to chronic exposure to nonionizing radiation: Secondary | ICD-10-CM

## 2022-04-01 DIAGNOSIS — L570 Actinic keratosis: Secondary | ICD-10-CM | POA: Diagnosis not present

## 2022-04-01 DIAGNOSIS — L820 Inflamed seborrheic keratosis: Secondary | ICD-10-CM

## 2022-04-01 DIAGNOSIS — L814 Other melanin hyperpigmentation: Secondary | ICD-10-CM | POA: Diagnosis not present

## 2022-04-01 NOTE — Patient Instructions (Signed)
Cryotherapy Aftercare  Wash gently with soap and water everyday.   Apply Vaseline and Band-Aid daily until healed.     Due to recent changes in healthcare laws, you may see results of your pathology and/or laboratory studies on MyChart before the doctors have had a chance to review them. We understand that in some cases there may be results that are confusing or concerning to you. Please understand that not all results are received at the same time and often the doctors may need to interpret multiple results in order to provide you with the best plan of care or course of treatment. Therefore, we ask that you please give us 2 business days to thoroughly review all your results before contacting the office for clarification. Should we see a critical lab result, you will be contacted sooner.   If You Need Anything After Your Visit  If you have any questions or concerns for your doctor, please call our main line at [phone] and press option 4 to reach your doctor's medical assistant. If no one answers, please leave a voicemail as directed and we will return your call as soon as possible. Messages left after 4 pm will be answered the following business day.   You may also send us a message via MyChart. We typically respond to MyChart messages within 1-2 business days.  For prescription refills, please ask your pharmacy to contact our office. Our fax number is [phone].  If you have an urgent issue when the clinic is closed that cannot wait until the next business day, you can page your doctor at the number below.    Please note that while we do our best to be available for urgent issues outside of office hours, we are not available 24/7.   If you have an urgent issue and are unable to reach us, you may choose to seek medical care at your doctor's office, retail clinic, urgent care center, or emergency room.  If you have a medical emergency, please immediately call 911 or go to the  emergency department.  Pager Numbers  - Dr. Kowalski: [phone]  - Dr. Moye: [phone]  - Dr. Stewart: [phone]  In the event of inclement weather, please call our main line at [phone] for an update on the status of any delays or closures.  Dermatology Medication Tips: Please keep the boxes that topical medications come in in order to help keep track of the instructions about where and how to use these. Pharmacies typically print the medication instructions only on the boxes and not directly on the medication tubes.   If your medication is too expensive, please contact our office at [phone] option 4 or send us a message through MyChart.   We are unable to tell what your co-pay for medications will be in advance as this is different depending on your insurance coverage. However, we may be able to find a substitute medication at lower cost or fill out paperwork to get insurance to cover a needed medication.   If a prior authorization is required to get your medication covered by your insurance company, please allow us 1-2 business days to complete this process.  Drug prices often vary depending on where the prescription is filled and some pharmacies may offer cheaper prices.  The website www.goodrx.com contains coupons for medications through different pharmacies. The prices here do not account for what the cost may be with help from insurance (it may be cheaper with your insurance), but the website can   give you the price if you did not use any insurance.  - You can print the associated coupon and take it with your prescription to the pharmacy.  - You may also stop by our office during regular business hours and pick up a GoodRx coupon card.  - If you need your prescription sent electronically to a different pharmacy, notify our office through [facility] MyChart or by phone at [phone] option 4.     Si Usted Necesita Algo Despus de Su Visita  Tambin puede  enviarnos un mensaje a travs de MyChart. Por lo general respondemos a los mensajes de MyChart en el transcurso de 1 a 2 das hbiles.  Para renovar recetas, por favor pida a su farmacia que se ponga en contacto con nuestra oficina. Nuestro nmero de fax es el [phone].  Si tiene un asunto urgente cuando la clnica est cerrada y que no puede esperar hasta el siguiente da hbil, puede llamar/localizar a su doctor(a) al nmero que aparece a continuacin.   Por favor, tenga en cuenta que aunque hacemos todo lo posible para estar disponibles para asuntos urgentes fuera del horario de oficina, no estamos disponibles las 24 horas del da, los 7 das de la semana.   Si tiene un problema urgente y no puede comunicarse con nosotros, puede optar por buscar atencin mdica  en el consultorio de su doctor(a), en una clnica privada, en un centro de atencin urgente o en una sala de emergencias.  Si tiene una emergencia mdica, por favor llame inmediatamente al 911 o vaya a la sala de emergencias.  Nmeros de bper  - Dr. Kowalski: [phone]  - Dra. Moye: [phone]  - Dra. Stewart: [phone]  En caso de inclemencias del tiempo, por favor llame a nuestra lnea principal al [phone] para una actualizacin sobre el estado de cualquier retraso o cierre.  Consejos para la medicacin en dermatologa: Por favor, guarde las cajas en las que vienen los medicamentos de uso tpico para ayudarle a seguir las instrucciones sobre dnde y cmo usarlos. Las farmacias generalmente imprimen las instrucciones del medicamento slo en las cajas y no directamente en los tubos del medicamento.   Si su medicamento es muy caro, por favor, pngase en contacto con nuestra oficina llamando al [phone] y presione la opcin 4 o envenos un mensaje a travs de MyChart.   No podemos decirle cul ser su copago por los medicamentos por adelantado ya que esto es diferente dependiendo de la cobertura de su seguro.  Sin embargo, es posible que podamos encontrar un medicamento sustituto a menor costo o llenar un formulario para que el seguro cubra el medicamento que se considera necesario.   Si se requiere una autorizacin previa para que su compaa de seguros cubra su medicamento, por favor permtanos de 1 a 2 das hbiles para completar este proceso.  Los precios de los medicamentos varan con frecuencia dependiendo del lugar de dnde se surte la receta y alguna farmacias pueden ofrecer precios ms baratos.  El sitio web www.goodrx.com tiene cupones para medicamentos de diferentes farmacias. Los precios aqu no tienen en cuenta lo que podra costar con la ayuda del seguro (puede ser ms barato con su seguro), pero el sitio web puede darle el precio si no utiliz ningn seguro.  - Puede imprimir el cupn correspondiente y llevarlo con su receta a la farmacia.  - Tambin puede pasar por nuestra oficina durante el horario de atencin regular y recoger una tarjeta de cupones de GoodRx.  -   Si necesita que su receta se enve electrnicamente a una farmacia diferente, informe a nuestra oficina a travs de MyChart de [facility] o por telfono llamando al [phone] y presione la opcin 4.  

## 2022-04-01 NOTE — Progress Notes (Signed)
   Follow-Up Visit   Subjective  Danielle Wang is a 66 y.o. female who presents for the following: Other (Scaly spots of face). The patient has spots, moles and lesions to be evaluated, some may be new or changing and the patient has concerns that these could be cancer.  The following portions of the chart were reviewed this encounter and updated as appropriate:   Allergies  Meds  Problems  Med Hx  Surg Hx  Fam Hx     Review of Systems:  No other skin or systemic complaints except as noted in HPI or Assessment and Plan.  Objective  Well appearing patient in no apparent distress; mood and affect are within normal limits.  A focused examination was performed including face. Relevant physical exam findings are noted in the Assessment and Plan.  Right Forehead Erythematous stuck-on, waxy papule or plaque  Right Upper Vermilion Lip, right brow (2) Erythematous thin papules/macules with gritty scale.  Pink scaly macule with associated yellow papule of right upper lip    Assessment & Plan   Lentigines - Scattered tan macules - Due to sun exposure - Benign-appearing, observe - Recommend daily broad spectrum sunscreen SPF 30+ to sun-exposed areas, reapply every 2 hours as needed. - Call for any changes  Actinic Damage - chronic, secondary to cumulative UV radiation exposure/sun exposure over time - diffuse scaly erythematous macules with underlying dyspigmentation - Recommend daily broad spectrum sunscreen SPF 30+ to sun-exposed areas, reapply every 2 hours as needed.  - Recommend staying in the shade or wearing long sleeves, sun glasses (UVA+UVB protection) and wide brim hats (4-inch brim around the entire circumference of the hat). - Call for new or changing lesions.  Inflamed seborrheic keratosis Right Forehead  Destruction of lesion - Right Forehead Complexity: simple   Destruction method: cryotherapy   Informed consent: discussed and consent obtained   Timeout:   patient name, date of birth, surgical site, and procedure verified Lesion destroyed using liquid nitrogen: Yes   Region frozen until ice ball extended beyond lesion: Yes   Outcome: patient tolerated procedure well with no complications   Post-procedure details: wound care instructions given    AK (actinic keratosis) (2) Right Upper Vermilion Lip, right brow  Destruction of lesion - Right Upper Vermilion Lip, right brow Complexity: simple   Destruction method: cryotherapy   Informed consent: discussed and consent obtained   Timeout:  patient name, date of birth, surgical site, and procedure verified Lesion destroyed using liquid nitrogen: Yes   Region frozen until ice ball extended beyond lesion: Yes   Outcome: patient tolerated procedure well with no complications   Post-procedure details: wound care instructions given     Return for Follow up as scheduled.  I, Ashok Cordia, CMA, am acting as scribe for Sarina Ser, MD . Documentation: I have reviewed the above documentation for accuracy and completeness, and I agree with the above.  Sarina Ser, MD

## 2022-04-09 IMAGING — MG MM DIGITAL SCREENING BILAT W/ TOMO AND CAD
8 series · 9 of 24 positions shown · non-contrast
Comparison: Previous exam(s).

CLINICAL DATA: Screening.

EXAM:
DIGITAL SCREENING BILATERAL MAMMOGRAM WITH TOMOSYNTHESIS AND CAD
TECHNIQUE: Bilateral screening digital craniocaudal and mediolateral oblique
mammograms were obtained. Bilateral screening digital breast
tomosynthesis was performed. The images were evaluated with
computer-aided detection.

[L CC synth-2D]
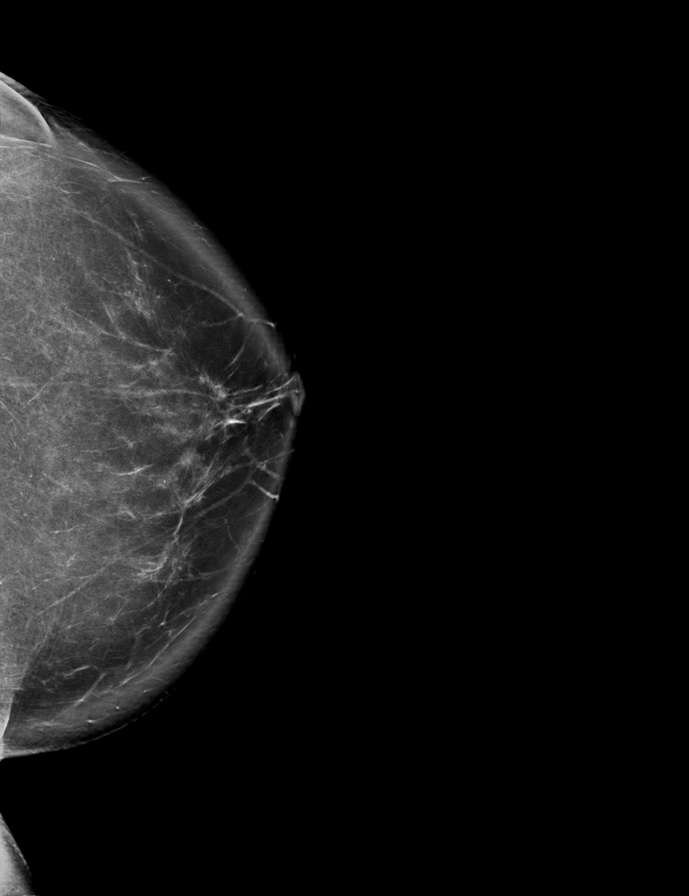

[R CC synth-2D]
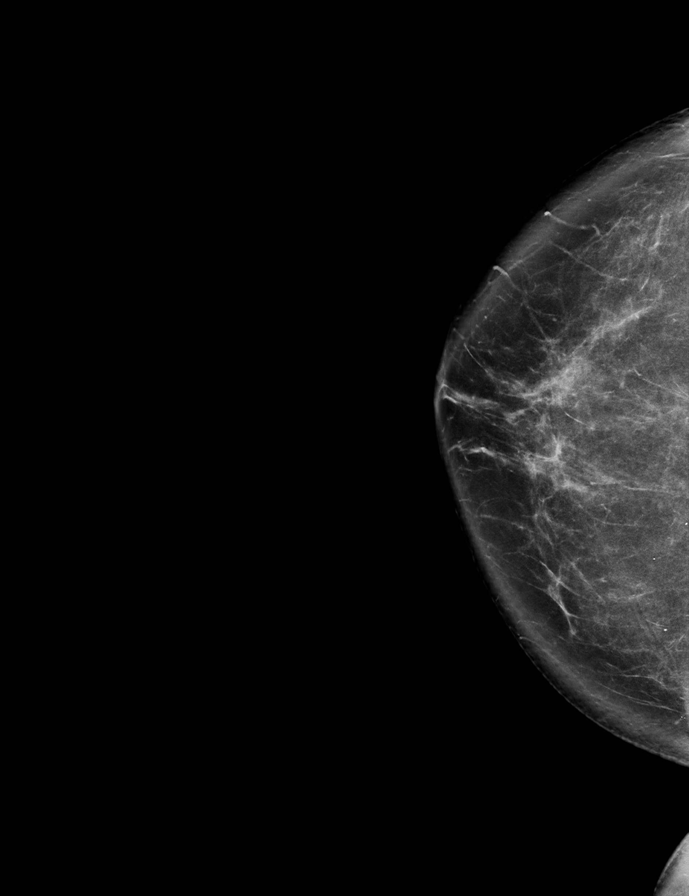

[L MLO synth-2D]
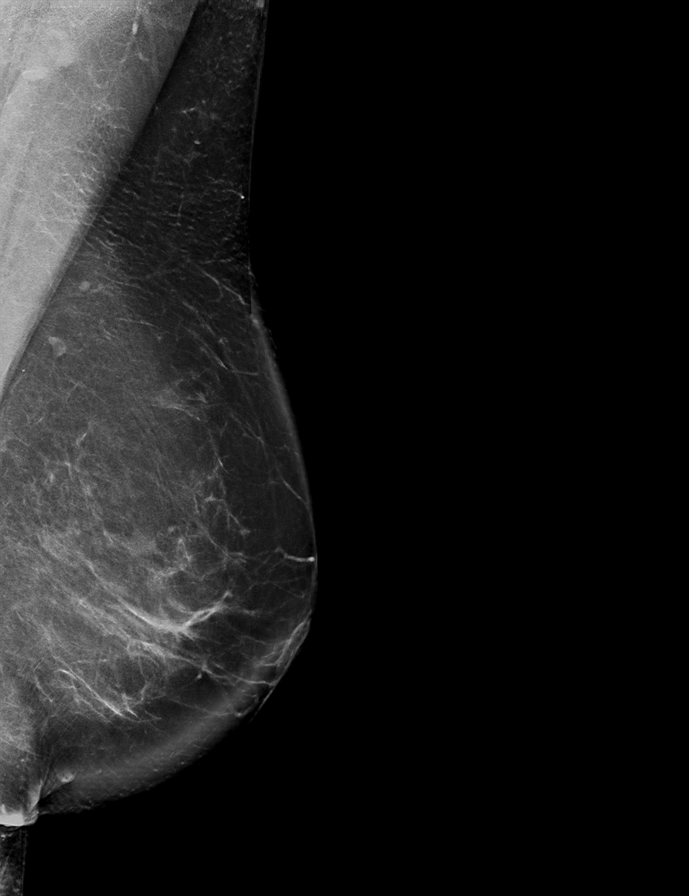

[R MLO synth-2D]
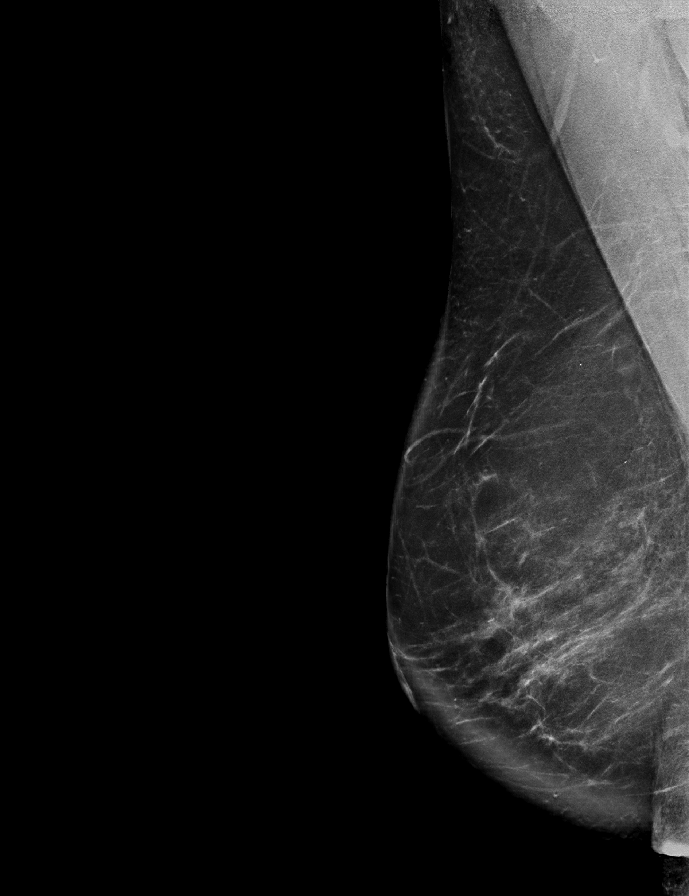

[R MLO tomo · 2 of 102 frames shown]
[frame 33/102]
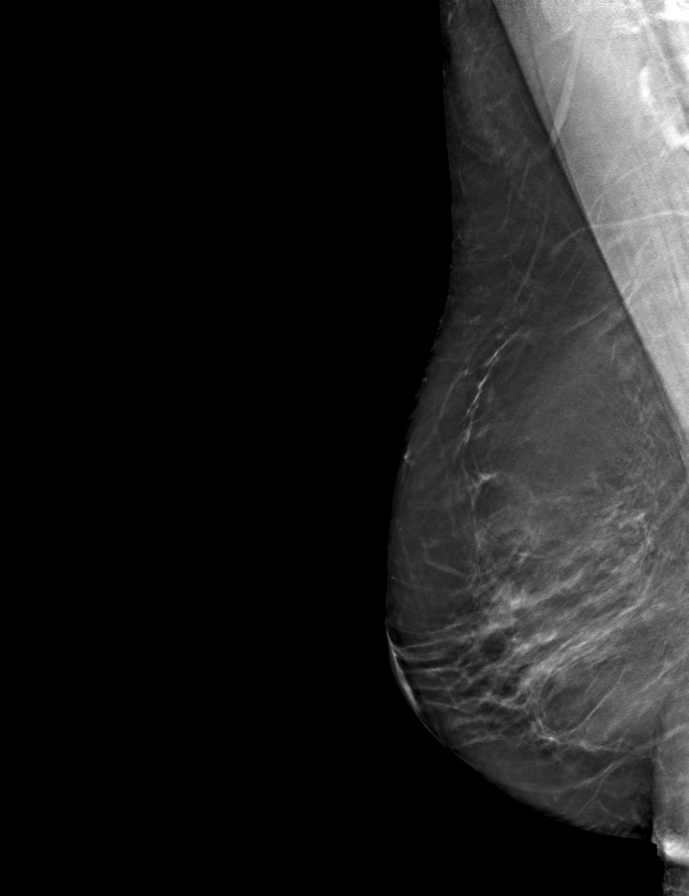
[frame 51/102]
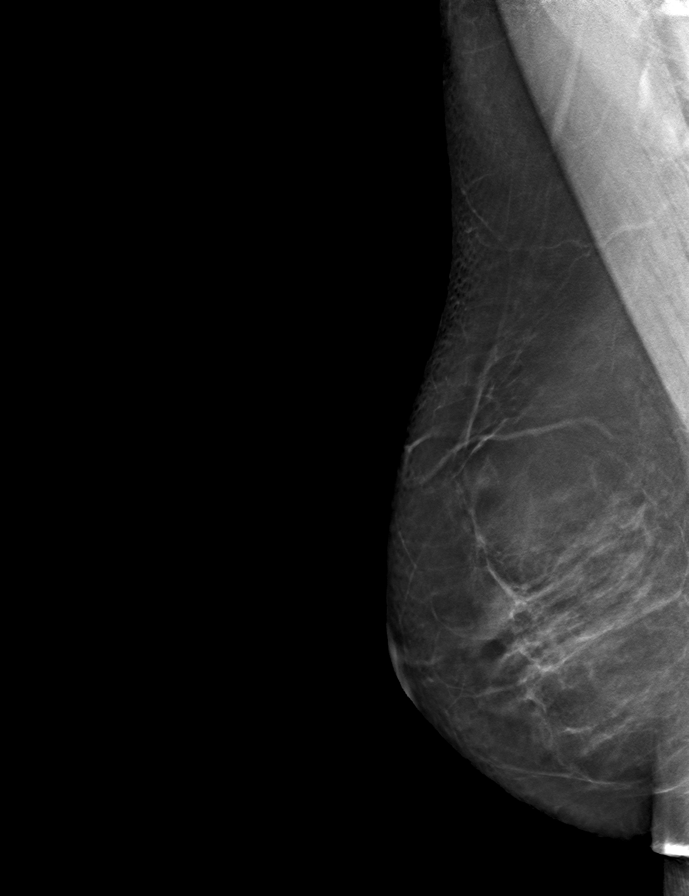

[L CC tomo · tomo slice 49/97.0]
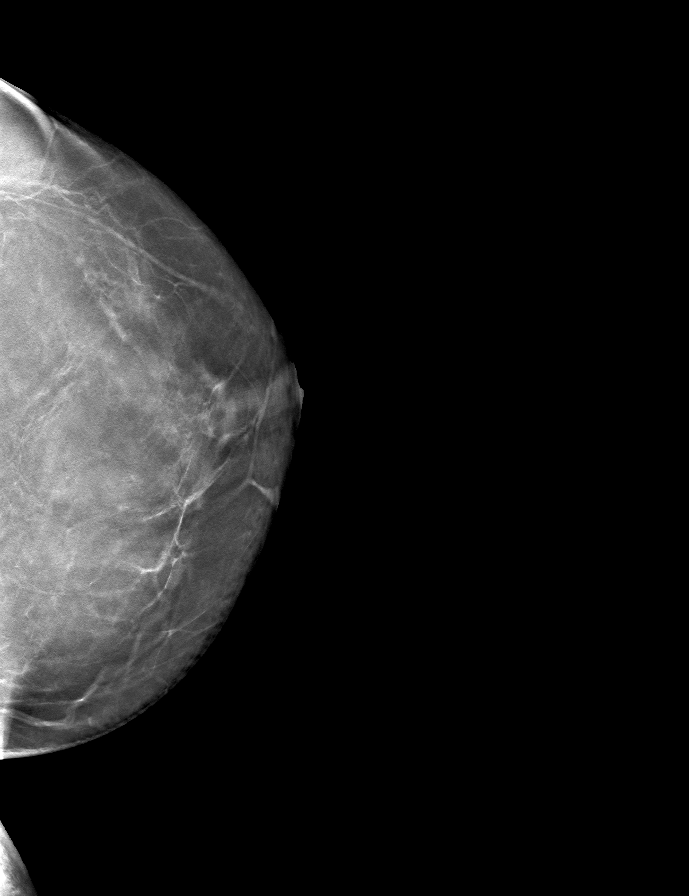

[L MLO tomo · tomo slice 53/104.0]
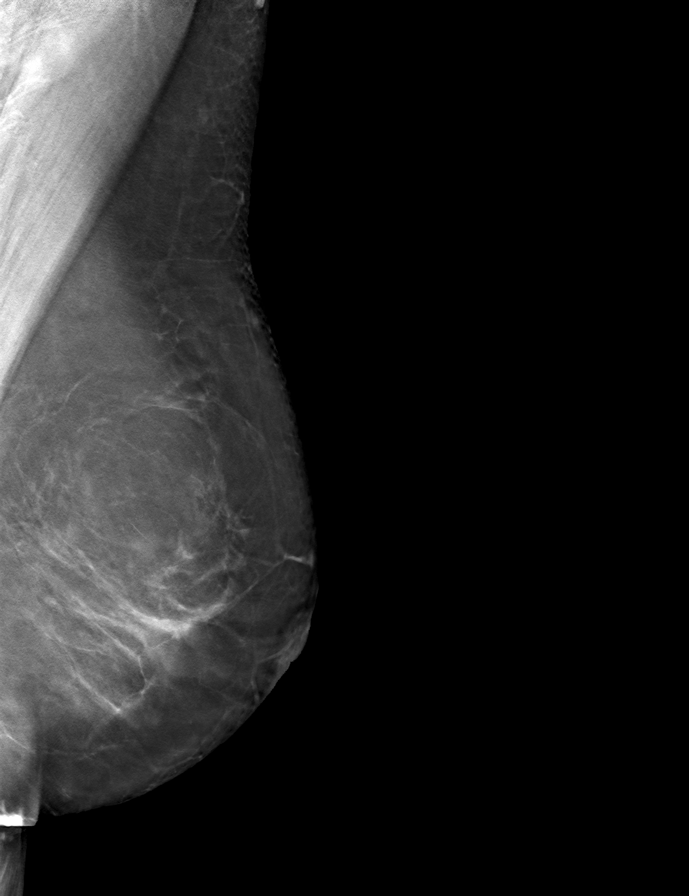

[R CC tomo · tomo slice 41/82.0]
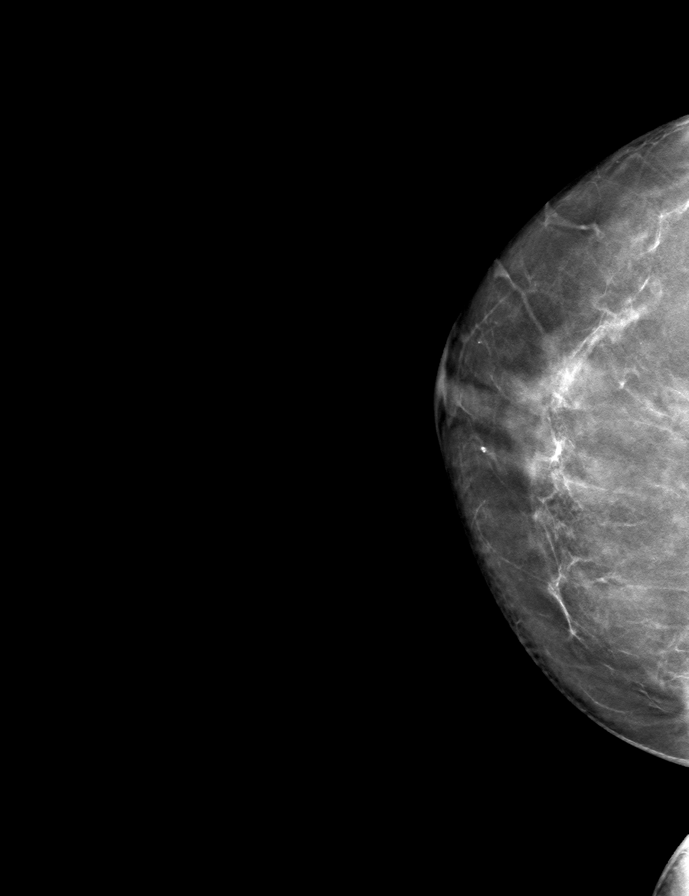

[9 of 24 positions shown; findings below may reference images not displayed]

ACR Breast Density Category b: There are scattered areas of
fibroglandular density.
FINDINGS: There are no findings suspicious for malignancy.
IMPRESSION: No mammographic evidence of malignancy. A result letter of this
screening mammogram will be mailed directly to the patient.

RECOMMENDATION:
Screening mammogram in one year. (Code:51-O-LD2)

BI-RADS CATEGORY  1: Negative.

## 2022-04-12 ENCOUNTER — Encounter: Payer: Self-pay | Admitting: Dermatology

## 2022-06-27 ENCOUNTER — Ambulatory Visit: Payer: Managed Care, Other (non HMO) | Admitting: Dermatology

## 2022-06-27 ENCOUNTER — Encounter: Payer: Self-pay | Admitting: Dermatology

## 2022-06-27 VITALS — BP 152/81 | HR 74

## 2022-06-27 DIAGNOSIS — L821 Other seborrheic keratosis: Secondary | ICD-10-CM

## 2022-06-27 DIAGNOSIS — L820 Inflamed seborrheic keratosis: Secondary | ICD-10-CM

## 2022-06-27 DIAGNOSIS — L578 Other skin changes due to chronic exposure to nonionizing radiation: Secondary | ICD-10-CM | POA: Diagnosis not present

## 2022-06-27 DIAGNOSIS — L570 Actinic keratosis: Secondary | ICD-10-CM

## 2022-06-27 NOTE — Progress Notes (Signed)
   Follow-Up Visit   Subjective  Danielle Wang is a 67 y.o. female who presents for the following: chek spot (L post leg, 2-24m, pt picks at, itchy prn) and check spot (R lat brow area, ). The patient has spots, moles and lesions to be evaluated, some may be new or changing and the patient has concerns that these could be cancer.  The following portions of the chart were reviewed this encounter and updated as appropriate:   Allergies  Meds  Problems  Med Hx  Surg Hx  Fam Hx     Review of Systems:  No other skin or systemic complaints except as noted in HPI or Assessment and Plan.  Objective  Well appearing patient in no apparent distress; mood and affect are within normal limits.  A focused examination was performed including face, left leg. Relevant physical exam findings are noted in the Assessment and Plan.  L post thigh x 1 Stuck on waxy paps with erythema  R lat brow x 1 Pink scaly macules   Assessment & Plan  Inflamed seborrheic keratosis L post thigh x 1 Symptomatic, irritating, patient would like treated. Destruction of lesion - L post thigh x 1 Complexity: simple   Destruction method: cryotherapy   Informed consent: discussed and consent obtained   Timeout:  patient name, date of birth, surgical site, and procedure verified Lesion destroyed using liquid nitrogen: Yes   Region frozen until ice ball extended beyond lesion: Yes   Outcome: patient tolerated procedure well with no complications   Post-procedure details: wound care instructions given    AK (actinic keratosis) R lat brow x 1 Destruction of lesion - R lat brow x 1 Complexity: simple   Destruction method: cryotherapy   Informed consent: discussed and consent obtained   Timeout:  patient name, date of birth, surgical site, and procedure verified Lesion destroyed using liquid nitrogen: Yes   Region frozen until ice ball extended beyond lesion: Yes   Outcome: patient tolerated procedure well with no  complications   Post-procedure details: wound care instructions given    Actinic Damage - chronic, secondary to cumulative UV radiation exposure/sun exposure over time - diffuse scaly erythematous macules with underlying dyspigmentation - Recommend daily broad spectrum sunscreen SPF 30+ to sun-exposed areas, reapply every 2 hours as needed.  - Recommend staying in the shade or wearing long sleeves, sun glasses (UVA+UVB protection) and wide brim hats (4-inch brim around the entire circumference of the hat). - Call for new or changing lesions.  Seborrheic Keratoses - Stuck-on, waxy, tan-brown papules and/or plaques  - Benign-appearing - Discussed benign etiology and prognosis. - Observe - Call for any changes  Return for as scheduled for TBSE, Hx of Dysplastic nevi, Hx of AKs.  I, Othelia Pulling, RMA, am acting as scribe for Sarina Ser, MD . Documentation: I have reviewed the above documentation for accuracy and completeness, and I agree with the above.  Sarina Ser, MD

## 2022-06-27 NOTE — Patient Instructions (Signed)
Due to recent changes in healthcare laws, you may see results of your pathology and/or laboratory studies on MyChart before the doctors have had a chance to review them. We understand that in some cases there may be results that are confusing or concerning to you. Please understand that not all results are received at the same time and often the doctors may need to interpret multiple results in order to provide you with the best plan of care or course of treatment. Therefore, we ask that you please give us 2 business days to thoroughly review all your results before contacting the office for clarification. Should we see a critical lab result, you will be contacted sooner.   If You Need Anything After Your Visit  If you have any questions or concerns for your doctor, please call our main line at [phone] and press option 4 to reach your doctor's medical assistant. If no one answers, please leave a voicemail as directed and we will return your call as soon as possible. Messages left after 4 pm will be answered the following business day.   You may also send us a message via MyChart. We typically respond to MyChart messages within 1-2 business days.  For prescription refills, please ask your pharmacy to contact our office. Our fax number is [phone].  If you have an urgent issue when the clinic is closed that cannot wait until the next business day, you can page your doctor at the number below.    Please note that while we do our best to be available for urgent issues outside of office hours, we are not available 24/7.   If you have an urgent issue and are unable to reach us, you may choose to seek medical care at your doctor's office, retail clinic, urgent care center, or emergency room.  If you have a medical emergency, please immediately call 911 or go to the emergency department.  Pager Numbers  - Dr. Kowalski: [phone]  - Dr. Moye: [phone]  - Dr. Stewart:  [phone]  In the event of inclement weather, please call our main line at [phone] for an update on the status of any delays or closures.  Dermatology Medication Tips: Please keep the boxes that topical medications come in in order to help keep track of the instructions about where and how to use these. Pharmacies typically print the medication instructions only on the boxes and not directly on the medication tubes.   If your medication is too expensive, please contact our office at [phone] option 4 or send us a message through MyChart.   We are unable to tell what your co-pay for medications will be in advance as this is different depending on your insurance coverage. However, we may be able to find a substitute medication at lower cost or fill out paperwork to get insurance to cover a needed medication.   If a prior authorization is required to get your medication covered by your insurance company, please allow us 1-2 business days to complete this process.  Drug prices often vary depending on where the prescription is filled and some pharmacies may offer cheaper prices.  The website www.goodrx.com contains coupons for medications through different pharmacies. The prices here do not account for what the cost may be with help from insurance (it may be cheaper with your insurance), but the website can give you the price if you did not use any insurance.  - You can print the associated coupon and take it with   your prescription to the pharmacy.  - You may also stop by our office during regular business hours and pick up a GoodRx coupon card.  - If you need your prescription sent electronically to a different pharmacy, notify our office through [facility] MyChart or by phone at [phone] option 4.     Si Usted Necesita Algo Despus de Su Visita  Tambin puede enviarnos un mensaje a travs de MyChart. Por lo general respondemos a los mensajes de MyChart en el transcurso de 1 a 2  das hbiles.  Para renovar recetas, por favor pida a su farmacia que se ponga en contacto con nuestra oficina. Nuestro nmero de fax es el [phone].  Si tiene un asunto urgente cuando la clnica est cerrada y que no puede esperar hasta el siguiente da hbil, puede llamar/localizar a su doctor(a) al nmero que aparece a continuacin.   Por favor, tenga en cuenta que aunque hacemos todo lo posible para estar disponibles para asuntos urgentes fuera del horario de oficina, no estamos disponibles las 24 horas del da, los 7 das de la semana.   Si tiene un problema urgente y no puede comunicarse con nosotros, puede optar por buscar atencin mdica  en el consultorio de su doctor(a), en una clnica privada, en un centro de atencin urgente o en una sala de emergencias.  Si tiene una emergencia mdica, por favor llame inmediatamente al 911 o vaya a la sala de emergencias.  Nmeros de bper  - Dr. Kowalski: [phone]  - Dra. Moye: [phone]  - Dra. Stewart: [phone]  En caso de inclemencias del tiempo, por favor llame a nuestra lnea principal al [phone] para una actualizacin sobre el estado de cualquier retraso o cierre.  Consejos para la medicacin en dermatologa: Por favor, guarde las cajas en las que vienen los medicamentos de uso tpico para ayudarle a seguir las instrucciones sobre dnde y cmo usarlos. Las farmacias generalmente imprimen las instrucciones del medicamento slo en las cajas y no directamente en los tubos del medicamento.   Si su medicamento es muy caro, por favor, pngase en contacto con nuestra oficina llamando al [phone] y presione la opcin 4 o envenos un mensaje a travs de MyChart.   No podemos decirle cul ser su copago por los medicamentos por adelantado ya que esto es diferente dependiendo de la cobertura de su seguro. Sin embargo, es posible que podamos encontrar un medicamento sustituto a menor costo o llenar un formulario para que el  seguro cubra el medicamento que se considera necesario.   Si se requiere una autorizacin previa para que su compaa de seguros cubra su medicamento, por favor permtanos de 1 a 2 das hbiles para completar este proceso.  Los precios de los medicamentos varan con frecuencia dependiendo del lugar de dnde se surte la receta y alguna farmacias pueden ofrecer precios ms baratos.  El sitio web www.goodrx.com tiene cupones para medicamentos de diferentes farmacias. Los precios aqu no tienen en cuenta lo que podra costar con la ayuda del seguro (puede ser ms barato con su seguro), pero el sitio web puede darle el precio si no utiliz ningn seguro.  - Puede imprimir el cupn correspondiente y llevarlo con su receta a la farmacia.  - Tambin puede pasar por nuestra oficina durante el horario de atencin regular y recoger una tarjeta de cupones de GoodRx.  - Si necesita que su receta se enve electrnicamente a una farmacia diferente, informe a nuestra oficina a travs de MyChart de [facility]   o por telfono llamando al [phone] y presione la opcin 4.  

## 2022-06-28 ENCOUNTER — Encounter: Payer: Self-pay | Admitting: Dermatology

## 2022-10-09 ENCOUNTER — Other Ambulatory Visit: Payer: Self-pay | Admitting: Family Medicine

## 2022-10-09 DIAGNOSIS — Z1231 Encounter for screening mammogram for malignant neoplasm of breast: Secondary | ICD-10-CM

## 2022-11-06 ENCOUNTER — Ambulatory Visit
Admission: RE | Admit: 2022-11-06 | Discharge: 2022-11-06 | Disposition: A | Discharge status: Home / Self Care | Payer: Managed Care, Other (non HMO) | Source: Ambulatory Visit | Attending: Family Medicine | Admitting: Family Medicine

## 2022-11-06 DIAGNOSIS — Z1231 Encounter for screening mammogram for malignant neoplasm of breast: Secondary | ICD-10-CM | POA: Diagnosis present

## 2023-01-30 ENCOUNTER — Ambulatory Visit: Payer: Managed Care, Other (non HMO) | Admitting: Dermatology

## 2023-01-30 ENCOUNTER — Encounter: Payer: Self-pay | Admitting: Dermatology

## 2023-01-30 DIAGNOSIS — D1801 Hemangioma of skin and subcutaneous tissue: Secondary | ICD-10-CM

## 2023-01-30 DIAGNOSIS — Z1283 Encounter for screening for malignant neoplasm of skin: Secondary | ICD-10-CM

## 2023-01-30 DIAGNOSIS — L814 Other melanin hyperpigmentation: Secondary | ICD-10-CM

## 2023-01-30 DIAGNOSIS — W908XXA Exposure to other nonionizing radiation, initial encounter: Secondary | ICD-10-CM | POA: Diagnosis not present

## 2023-01-30 DIAGNOSIS — L820 Inflamed seborrheic keratosis: Secondary | ICD-10-CM | POA: Diagnosis not present

## 2023-01-30 DIAGNOSIS — L578 Other skin changes due to chronic exposure to nonionizing radiation: Secondary | ICD-10-CM | POA: Diagnosis not present

## 2023-01-30 DIAGNOSIS — L738 Other specified follicular disorders: Secondary | ICD-10-CM | POA: Diagnosis not present

## 2023-01-30 DIAGNOSIS — Z86018 Personal history of other benign neoplasm: Secondary | ICD-10-CM

## 2023-01-30 DIAGNOSIS — D229 Melanocytic nevi, unspecified: Secondary | ICD-10-CM

## 2023-01-30 DIAGNOSIS — Z872 Personal history of diseases of the skin and subcutaneous tissue: Secondary | ICD-10-CM

## 2023-01-30 DIAGNOSIS — L821 Other seborrheic keratosis: Secondary | ICD-10-CM

## 2023-01-30 NOTE — Progress Notes (Signed)
Follow-Up Visit   Subjective  Danielle Wang is a 67 y.o. female who presents for the following: Skin Cancer Screening and Full Body Skin Exam hx of dysplastic nevi, hx of isks, hx of aks, hx of ak at right lat brow Reports some spots at face, right forearm, and left leg  The patient presents for Total-Body Skin Exam (TBSE) for skin cancer screening and mole check. The patient has spots, moles and lesions to be evaluated, some may be new or changing and the patient may have concern these could be cancer.    The following portions of the chart were reviewed this encounter and updated as appropriate: medications, allergies, medical history  Review of Systems:  No other skin or systemic complaints except as noted in HPI or Assessment and Plan.  Objective  Well appearing patient in no apparent distress; mood and affect are within normal limits.  A full examination was performed including scalp, head, eyes, ears, nose, lips, neck, chest, axillae, abdomen, back, buttocks, bilateral upper extremities, bilateral lower extremities, hands, feet, fingers, toes, fingernails, and toenails. All findings within normal limits unless otherwise noted below.   Relevant physical exam findings are noted in the Assessment and Plan.  right forearm x 1, left cheek x 1 (2) Erythematous stuck-on, waxy papule or plaque    Assessment & Plan   SKIN CANCER SCREENING PERFORMED TODAY.  Sebaceous Hyperplasia At forehead  - Small yellow papules with a central dell - Benign-appearing - Observe. Call for changes.   ACTINIC DAMAGE - Chronic condition, secondary to cumulative UV/sun exposure - diffuse scaly erythematous macules with underlying dyspigmentation - Recommend daily broad spectrum sunscreen SPF 30+ to sun-exposed areas, reapply every 2 hours as needed.  - Staying in the shade or wearing long sleeves, sun glasses (UVA+UVB protection) and wide brim hats (4-inch brim around the entire circumference of  the hat) are also recommended for sun protection.  - Call for new or changing lesions.  LENTIGINES, SEBORRHEIC KERATOSES, HEMANGIOMAS - Benign normal skin lesions - Benign-appearing - Call for any changes  MELANOCYTIC NEVI - Tan-brown and/or pink-flesh-colored symmetric macules and papules - Benign appearing on exam today - Observation - Call clinic for new or changing moles - Recommend daily use of broad spectrum spf 30+ sunscreen to sun-exposed areas.    HISTORY OF DYSPLASTIC NEVUS Left scapula mild 10/2019 Left superior breast mild 10/2019 Right lateral braline - 03/2006 No evidence of recurrence today Recommend regular full body skin exams Recommend daily broad spectrum sunscreen SPF 30+ to sun-exposed areas, reapply every 2 hours as needed.  Call if any new or changing lesions are noted between office visits    Inflamed seborrheic keratosis (2) right forearm x 1, left cheek x 1  Symptomatic, irritating, patient would like treated.  Destruction of lesion - right forearm x 1, left cheek x 1 (2) Complexity: simple   Destruction method: cryotherapy   Informed consent: discussed and consent obtained   Timeout:  patient name, date of birth, surgical site, and procedure verified Lesion destroyed using liquid nitrogen: Yes   Region frozen until ice ball extended beyond lesion: Yes   Outcome: patient tolerated procedure well with no complications   Post-procedure details: wound care instructions given     Return in about 1 year (around 01/30/2024) for TBSE.  Geralynn Rile, CMA, am acting as scribe for Armida Sans, MD.   Documentation: I have reviewed the above documentation for accuracy and completeness, and I agree with the  above.  Armida Sans, MD

## 2023-01-30 NOTE — Patient Instructions (Addendum)
Seborrheic Keratosis  What causes seborrheic keratoses? Seborrheic keratoses are harmless, common skin growths that first appear during adult life.  As time goes by, more growths appear.  Some people may develop a large number of them.  Seborrheic keratoses appear on both covered and uncovered body parts.  They are not caused by sunlight.  The tendency to develop seborrheic keratoses can be inherited.  They vary in color from skin-colored to gray, brown, or even black.  They can be either smooth or have a rough, warty surface.   Seborrheic keratoses are superficial and look as if they were stuck on the skin.  Under the microscope this type of keratosis looks like layers upon layers of skin.  That is why at times the top layer may seem to fall off, but the rest of the growth remains and re-grows.    Treatment Seborrheic keratoses do not need to be treated, but can easily be removed in the office.  Seborrheic keratoses often cause symptoms when they rub on clothing or jewelry.  Lesions can be in the way of shaving.  If they become inflamed, they can cause itching, soreness, or burning.  Removal of a seborrheic keratosis can be accomplished by freezing, burning, or surgery. If any spot bleeds, scabs, or grows rapidly, please return to have it checked, as these can be an indication of a skin cancer.  Cryotherapy Aftercare  Wash gently with soap and water everyday.   Apply Vaseline and Band-Aid daily until healed.   For rough skin at feet   Recommend starting moisturizer with exfoliant (Urea, Salicylic acid, or Lactic acid) one to two times daily to help smooth rough  AmLactin 12% lotion/cream (Lactic Acid).  If applying in morning, also apply sunscreen to sun-exposed areas, since these exfoliating moisturizers can increase sensitivity to sun.    Gentle Skin Care Guide  1. Bathe no more than once a day.  2. Avoid bathing in hot water  3. Use a mild soap like Dove, Vanicream, Cetaphil, CeraVe.  Can use Lever 2000 or Cetaphil antibacterial soap  4. Use soap only where you need it. On most days, use it under your arms, between your legs, and on your feet. Let the water rinse other areas unless visibly dirty.  5. When you get out of the bath/shower, use a towel to gently blot your skin dry, don't rub it.  6. While your skin is still a little damp, apply a moisturizing cream such as Vanicream, CeraVe, Cetaphil, Eucerin, Sarna lotion or plain Vaseline Jelly. For hands apply Neutrogena [place] Hand Cream or Excipial Hand Cream.  7. Reapply moisturizer any time you start to itch or feel dry.  8. Sometimes using free and clear laundry detergents can be helpful. Fabric softener sheets should be avoided. Downy Free & Gentle liquid, or any liquid fabric softener that is free of dyes and perfumes, it acceptable to use  9. If your doctor has given you prescription creams you may apply moisturizers over them      Melanoma ABCDEs  Melanoma is the most dangerous type of skin cancer, and is the leading cause of death from skin disease.  You are more likely to develop melanoma if you: Have light-colored skin, light-colored eyes, or red or blond hair Spend a lot of time in the sun Tan regularly, either outdoors or in a tanning bed Have had blistering sunburns, especially during childhood Have a close family member who has had a melanoma Have atypical moles or large  birthmarks  Early detection of melanoma is key since treatment is typically straightforward and cure rates are extremely high if we catch it early.   The first sign of melanoma is often a change in a mole or a new dark spot.  The ABCDE system is a way of remembering the signs of melanoma.  A for asymmetry:  The two halves do not match. B for border:  The edges of the growth are irregular. C for color:  A mixture of colors are present instead of an even brown color. D for diameter:  Melanomas are usually (but not always) greater  than 6mm - the size of a pencil eraser. E for evolution:  The spot keeps changing in size, shape, and color.  Please check your skin once per month between visits. You can use a small mirror in front and a large mirror behind you to keep an eye on the back side or your body.   If you see any new or changing lesions before your next follow-up, please call to schedule a visit.  Please continue daily skin protection including broad spectrum sunscreen SPF 30+ to sun-exposed areas, reapplying every 2 hours as needed when you're outdoors.   Staying in the shade or wearing long sleeves, sun glasses (UVA+UVB protection) and wide brim hats (4-inch brim around the entire circumference of the hat) are also recommended for sun protection.     Due to recent changes in healthcare laws, you may see results of your pathology and/or laboratory studies on MyChart before the doctors have had a chance to review them. We understand that in some cases there may be results that are confusing or concerning to you. Please understand that not all results are received at the same time and often the doctors may need to interpret multiple results in order to provide you with the best plan of care or course of treatment. Therefore, we ask that you please give [place] 2 business days to thoroughly review all your results before contacting the office for clarification. Should we see a critical lab result, you will be contacted sooner.   If You Need Anything After Your Visit  If you have any questions or concerns for your doctor, please call our main line at [phone] and press option 4 to reach your doctor's medical assistant. If no one answers, please leave a voicemail as directed and we will return your call as soon as possible. Messages left after 4 pm will be answered the following business day.   You may also send [place] a message via MyChart. We typically respond to MyChart messages within 1-2 business days.  For prescription  refills, please ask your pharmacy to contact our office. Our fax number is [phone].  If you have an urgent issue when the clinic is closed that cannot wait until the next business day, you can page your doctor at the number below.    Please note that while we do our best to be available for urgent issues outside of office hours, we are not available 24/7.   If you have an urgent issue and are unable to reach [place], you may choose to seek medical care at your doctor's office, retail clinic, urgent care center, or emergency room.  If you have a medical emergency, please immediately call 911 or go to the emergency department.  Pager Numbers  - Dr. Gwen Pounds: [phone]  - Dr. Roseanne Reno: [phone]  - Dr. Katrinka Blazing: [phone]   In the event of inclement  weather, please call our main line at [phone] for an update on the status of any delays or closures.  Dermatology Medication Tips: Please keep the boxes that topical medications come in in order to help keep track of the instructions about where and how to use these. Pharmacies typically print the medication instructions only on the boxes and not directly on the medication tubes.   If your medication is too expensive, please contact our office at [phone] option 4 or send [place] a message through MyChart.   We are unable to tell what your co-pay for medications will be in advance as this is different depending on your insurance coverage. However, we may be able to find a substitute medication at lower cost or fill out paperwork to get insurance to cover a needed medication.   If a prior authorization is required to get your medication covered by your insurance company, please allow [place] 1-2 business days to complete this process.  Drug prices often vary depending on where the prescription is filled and some pharmacies may offer cheaper prices.  The website www.goodrx.com contains coupons for medications through different pharmacies.  The prices here do not account for what the cost may be with help from insurance (it may be cheaper with your insurance), but the website can give you the price if you did not use any insurance.  - You can print the associated coupon and take it with your prescription to the pharmacy.  - You may also stop by our office during regular business hours and pick up a GoodRx coupon card.  - If you need your prescription sent electronically to a different pharmacy, notify our office through [facility] or by phone at [phone] option 4.     Si Usted Necesita Algo Despus de Su Visita  Tambin puede enviarnos un mensaje a travs de Clinical cytogeneticist. Por lo general respondemos a los mensajes de MyChart en el transcurso de 1 a 2 das hbiles.  Para renovar recetas, por favor pida a su farmacia que se ponga en contacto con nuestra oficina. Annie Sable de fax es [place] [phone].  Si tiene un asunto urgente cuando la clnica est cerrada y que no puede esperar hasta el siguiente da hbil, puede llamar/localizar a su doctor(a) al nmero que aparece a continuacin.   Por favor, tenga en cuenta que aunque hacemos todo lo posible para estar disponibles para asuntos urgentes fuera del horario de [place], no estamos disponibles las 24 horas del da, los 7 [address]  [address] de la [place].   Si tiene un problema urgente y no puede comunicarse con nosotros, puede optar por buscar atencin mdica  en el consultorio de su doctor(a), en una clnica privada, en un centro de atencin urgente o en una sala de emergencias.  Si tiene Engineer, drilling, por favor llame inmediatamente al 911 o vaya a la sala de emergencias.  Nmeros de bper  - Dr. Gwen Pounds: [phone]  - Dra. Roseanne Reno: [phone]  - Dr. Katrinka Blazing: [phone]   En caso de inclemencias del tiempo, por favor llame a Lacy Duverney principal al [phone] para una actualizacin sobre el [place] de cualquier retraso o cierre.  Consejos para la medicacin  en dermatologa: Por favor, guarde las cajas en las que vienen los medicamentos de uso tpico para ayudarle a seguir las instrucciones sobre dnde y cmo usarlos. Las farmacias generalmente imprimen las instrucciones del medicamento slo en las cajas y no directamente en los tubos del [place].   Si su medicamento es  muy caro, por favor, pngase en contacto con nuestra oficina llamando al [phone] y presione la opcin 4 o envenos un mensaje a travs de Clinical cytogeneticist.   No podemos decirle cul ser su copago por los medicamentos por adelantado ya que esto es diferente dependiendo de la cobertura de su seguro. Sin embargo, es posible que podamos encontrar un medicamento sustituto a Audiological scientist un formulario para que el seguro cubra el medicamento que se considera necesario.   Si se requiere una autorizacin previa para que su compaa de seguros [place] su medicamento, por favor permtanos de 1 a 2 das hbiles para completar [address]  Los precios de los medicamentos varan con frecuencia dependiendo del Environmental consultant de dnde se surte la receta y alguna farmacias pueden ofrecer precios ms baratos.  El sitio web www.goodrx.com tiene cupones para medicamentos de Health and safety inspector. Los precios aqu no tienen en cuenta lo que podra costar con la ayuda del seguro (puede ser ms barato con su seguro), pero el sitio web puede darle el precio si no utiliz Tourist information centre manager.  - Puede imprimir el cupn correspondiente y llevarlo con su receta a la farmacia.  - Tambin puede pasar por nuestra oficina durante el horario de atencin regular y Education officer, museum una tarjeta de cupones de GoodRx.  - Si necesita que su receta se enve electrnicamente a una farmacia diferente, informe a nuestra oficina a travs de MyChart de [place] o por telfono llamando al [phone] y presione la opcin 4.

## 2023-02-15 ENCOUNTER — Encounter: Payer: Self-pay | Admitting: Dermatology

## 2023-10-20 ENCOUNTER — Other Ambulatory Visit: Payer: Self-pay | Admitting: Family Medicine

## 2023-10-20 DIAGNOSIS — Z1231 Encounter for screening mammogram for malignant neoplasm of breast: Secondary | ICD-10-CM

## 2023-11-07 ENCOUNTER — Ambulatory Visit
Admission: RE | Admit: 2023-11-07 | Discharge: 2023-11-07 | Disposition: A | Discharge status: Home / Self Care | Source: Ambulatory Visit | Attending: Family Medicine | Admitting: Family Medicine

## 2023-11-07 DIAGNOSIS — Z1231 Encounter for screening mammogram for malignant neoplasm of breast: Secondary | ICD-10-CM | POA: Diagnosis present

## 2024-02-10 ENCOUNTER — Ambulatory Visit: Payer: Managed Care, Other (non HMO) | Admitting: Dermatology

## 2024-02-10 DIAGNOSIS — L820 Inflamed seborrheic keratosis: Secondary | ICD-10-CM

## 2024-02-10 DIAGNOSIS — Z808 Family history of malignant neoplasm of other organs or systems: Secondary | ICD-10-CM

## 2024-02-10 DIAGNOSIS — L719 Rosacea, unspecified: Secondary | ICD-10-CM

## 2024-02-10 DIAGNOSIS — Z7189 Other specified counseling: Secondary | ICD-10-CM

## 2024-02-10 DIAGNOSIS — L570 Actinic keratosis: Secondary | ICD-10-CM

## 2024-02-10 DIAGNOSIS — Z1283 Encounter for screening for malignant neoplasm of skin: Secondary | ICD-10-CM

## 2024-02-10 DIAGNOSIS — L814 Other melanin hyperpigmentation: Secondary | ICD-10-CM

## 2024-02-10 DIAGNOSIS — D229 Melanocytic nevi, unspecified: Secondary | ICD-10-CM

## 2024-02-10 DIAGNOSIS — Z86018 Personal history of other benign neoplasm: Secondary | ICD-10-CM

## 2024-02-10 DIAGNOSIS — L578 Other skin changes due to chronic exposure to nonionizing radiation: Secondary | ICD-10-CM

## 2024-02-10 DIAGNOSIS — L821 Other seborrheic keratosis: Secondary | ICD-10-CM

## 2024-02-10 DIAGNOSIS — I781 Nevus, non-neoplastic: Secondary | ICD-10-CM

## 2024-02-10 NOTE — Progress Notes (Unsigned)
 Follow-Up Visit   Subjective  Danielle Wang is a 68 y.o. female who presents for the following: Skin Cancer Screening and Full Body Skin Exam Hx of dysplastic nevus, hx of aks and isks,   Patient reports she has some spots at left cheek, left temple and mole at right abdomen she would like checked.   The patient presents for Total-Body Skin Exam (TBSE) for skin cancer screening and mole check. The patient has spots, moles and lesions to be evaluated, some may be new or changing and the patient may have concern these could be cancer.  The following portions of the chart were reviewed this encounter and updated as appropriate: medications, allergies, medical history  Review of Systems:  No other skin or systemic complaints except as noted in HPI or Assessment and Plan.  Objective  Well appearing patient in no apparent distress; mood and affect are within normal limits.  A full examination was performed including scalp, head, eyes, ears, nose, lips, neck, chest, axillae, abdomen, back, buttocks, bilateral upper extremities, bilateral lower extremities, hands, feet, fingers, toes, fingernails, and toenails. All findings within normal limits unless otherwise noted below.   Relevant physical exam findings are noted in the Assessment and Plan.  right abdomen x 1, right face x 4, legs x 3 (8) Erythematous stuck-on, waxy papule or plaque left face x 3 (3) Erythematous thin papules/macules with gritty scale.   Assessment & Plan   HISTORY OF DYSPLASTIC NEVUS Left scapula mild 10/2019 Left superior breast mild 10/2019 Right lateral braline - 03/2006 No evidence of recurrence today Recommend regular full body skin exams Recommend daily broad spectrum sunscreen SPF 30+ to sun-exposed areas, reapply every 2 hours as needed.  Call if any new or changing lesions are noted between office visits  FAMILY HISTORY OF SKIN CANCER What type(s):SCC  Who affected:father   SKIN CANCER SCREENING  PERFORMED TODAY.  ACTINIC DAMAGE - Chronic condition, secondary to cumulative UV/sun exposure - diffuse scaly erythematous macules with underlying dyspigmentation - Recommend daily broad spectrum sunscreen SPF 30+ to sun-exposed areas, reapply every 2 hours as needed.  - Staying in the shade or wearing long sleeves, sun glasses (UVA+UVB protection) and wide brim hats (4-inch brim around the entire circumference of the hat) are also recommended for sun protection.  - Call for new or changing lesions.  LENTIGINES, SEBORRHEIC KERATOSES, HEMANGIOMAS - Benign normal skin lesions - Benign-appearing - Call for any changes  MELANOCYTIC NEVI - Tan-brown and/or pink-flesh-colored symmetric macules and papules - Benign appearing on exam today - Observation - Call clinic for new or changing moles - Recommend daily use of broad spectrum spf 30+ sunscreen to sun-exposed areas.    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 Condition is symptomatic/ bothersome to patient. Not currently at goal. Rosacea is a chronic progressive skin condition usually affecting the face of adults, causing redness and/or acne bumps. It is treatable but not curable. It sometimes affects the eyes (ocular rosacea) as well. It may respond to topical and/or systemic medication and can flare with stress, sun exposure, alcohol, exercise, topical steroids (including hydrocortisone/cortisone 10) and some foods.  Daily application of broad spectrum spf 30+ sunscreen to face is recommended to reduce flares.  Patient denies grittiness of the eyes   Treatment Plan Discussed Counseling for BBL / IPL / Laser and Coordination of Care Discussed the treatment option of Broad Band Light (BBL) /  Intense Pulsed Light (IPL)/ Laser for skin discoloration, including brown spots and redness.  Typically we recommend at least 1-3  treatment sessions about 5-8 weeks apart for best results.  Cannot have tanned skin when BBL performed, and regular use of sunscreen/photoprotection is advised after the procedure to help maintain results. The patient's condition may also require maintenance treatments in the future.  The fee for BBL / laser treatments is $350 per treatment session for the whole face.  A fee can be quoted for other parts of the body.  Insurance typically does not pay for BBL/laser treatments and therefore the fee is an out-of-pocket cost. Recommend prophylactic valtrex treatment. Once scheduled for procedure, will send Rx in prior to patient's appointment.   INFLAMED SEBORRHEIC KERATOSIS (8) right abdomen x 1, right face x 4, legs x 3 (8) Symptomatic, irritating, patient would like treated. Destruction of lesion - right abdomen x 1, right face x 4, legs x 3 (8) Complexity: simple   Destruction method: cryotherapy   Informed consent: discussed and consent obtained   Timeout:  patient name, date of birth, surgical site, and procedure verified Lesion destroyed using liquid nitrogen: Yes   Region frozen until ice ball extended beyond lesion: Yes   Outcome: patient tolerated procedure well with no complications   Post-procedure details: wound care instructions given    ACTINIC KERATOSIS (3) left face x 3 (3) Actinic keratoses are precancerous spots that appear secondary to cumulative UV radiation exposure/sun exposure over time. They are chronic with expected duration over 1 year. A portion of actinic keratoses will progress to squamous cell carcinoma of the skin. It is not possible to reliably predict which spots will progress to skin cancer and so treatment is recommended to prevent development of skin cancer.  Recommend daily broad spectrum sunscreen SPF 30+ to sun-exposed areas, reapply every 2 hours as needed.  Recommend staying in the shade or wearing long sleeves, sun glasses (UVA+UVB protection) and wide  brim hats (4-inch brim around the entire circumference of the hat). Call for new or changing lesions. Destruction of lesion - left face x 3 (3) Complexity: simple   Destruction method: cryotherapy   Informed consent: discussed and consent obtained   Timeout:  patient name, date of birth, surgical site, and procedure verified Lesion destroyed using liquid nitrogen: Yes   Region frozen until ice ball extended beyond lesion: Yes   Outcome: patient tolerated procedure well with no complications   Post-procedure details: wound care instructions given    Return in about 1 year (around 02/09/2025) for TBSE.  IEleanor Blush, CMA, am acting as scribe for Alm Rhyme, MD.   Documentation: I have reviewed the above documentation for accuracy and completeness, and I agree with the above.  Alm Rhyme, MD

## 2024-02-10 NOTE — Patient Instructions (Addendum)
 Seborrheic Keratosis  What causes seborrheic keratoses? Seborrheic keratoses are harmless, common skin growths that first appear during adult life.  As time goes by, more growths appear.  Some people may develop a large number of them.  Seborrheic keratoses appear on both covered and uncovered body parts.  They are not caused by sunlight.  The tendency to develop seborrheic keratoses can be inherited.  They vary in color from skin-colored to gray, brown, or even black.  They can be either smooth or have a rough, warty surface.   Seborrheic keratoses are superficial and look as if they were stuck on the skin.  Under the microscope this type of keratosis looks like layers upon layers of skin.  That is why at times the top layer may seem to fall off, but the rest of the growth remains and re-grows.    Treatment Seborrheic keratoses do not need to be treated, but can easily be removed in the office.  Seborrheic keratoses often cause symptoms when they rub on clothing or jewelry.  Lesions can be in the way of shaving.  If they become inflamed, they can cause itching, soreness, or burning.  Removal of a seborrheic keratosis can be accomplished by freezing, burning, or surgery. If any spot bleeds, scabs, or grows rapidly, please return to have it checked, as these can be an indication of a skin cancer.   Cryotherapy Aftercare  Wash gently with soap and water everyday.   Apply Vaseline and Band-Aid daily until healed.    Actinic keratoses are precancerous spots that appear secondary to cumulative UV radiation exposure/sun exposure over time. They are chronic with expected duration over 1 year. A portion of actinic keratoses will progress to squamous cell carcinoma of the skin. It is not possible to reliably predict which spots will progress to skin cancer and so treatment is recommended to prevent development of skin cancer.  Recommend daily broad spectrum sunscreen SPF 30+ to sun-exposed areas, reapply  every 2 hours as needed.  Recommend staying in the shade or wearing long sleeves, sun glasses (UVA+UVB protection) and wide brim hats (4-inch brim around the entire circumference of the hat). Call for new or changing lesions.     Melanoma ABCDEs  Melanoma is the most dangerous type of skin cancer, and is the leading cause of death from skin disease.  You are more likely to develop melanoma if you: Have light-colored skin, light-colored eyes, or red or blond hair Spend a lot of time in the sun Tan regularly, either outdoors or in a tanning bed Have had blistering sunburns, especially during childhood Have a close family member who has had a melanoma Have atypical moles or large birthmarks  Early detection of melanoma is key since treatment is typically straightforward and cure rates are extremely high if we catch it early.   The first sign of melanoma is often a change in a mole or a new dark spot.  The ABCDE system is a way of remembering the signs of melanoma.  A for asymmetry:  The two halves do not match. B for border:  The edges of the growth are irregular. C for color:  A mixture of colors are present instead of an even brown color. D for diameter:  Melanomas are usually (but not always) greater than 6mm - the size of a pencil eraser. E for evolution:  The spot keeps changing in size, shape, and color.  Please check your skin once per month between visits. You can  use a small mirror in front and a large mirror behind you to keep an eye on the back side or your body.   If you see any new or changing lesions before your next follow-up, please call to schedule a visit.  Please continue daily skin protection including broad spectrum sunscreen SPF 30+ to sun-exposed areas, reapplying every 2 hours as needed when you're outdoors.   Staying in the shade or wearing long sleeves, sun glasses (UVA+UVB protection) and wide brim hats (4-inch brim around the entire circumference of the hat)  are also recommended for sun protection.    Due to recent changes in healthcare laws, you may see results of your pathology and/or laboratory studies on MyChart before the doctors have had a chance to review them. We understand that in some cases there may be results that are confusing or concerning to you. Please understand that not all results are received at the same time and often the doctors may need to interpret multiple results in order to provide you with the best plan of care or course of treatment. Therefore, we ask that you please give us  2 business days to thoroughly review all your results before contacting the office for clarification. Should we see a critical lab result, you will be contacted sooner.   If You Need Anything After Your Visit  If you have any questions or concerns for your doctor, please call our main line at [phone] and press option 4 to reach your doctor's medical assistant. If no one answers, please leave a voicemail as directed and we will return your call as soon as possible. Messages left after 4 pm will be answered the following business day.   You may also send us  a message via MyChart. We typically respond to MyChart messages within 1-2 business days.  For prescription refills, please ask your pharmacy to contact our office. Our fax number is [phone].  If you have an urgent issue when the clinic is closed that cannot wait until the next business day, you can page your doctor at the number below.    Please note that while we do our best to be available for urgent issues outside of office hours, we are not available 24/7.   If you have an urgent issue and are unable to reach us , you may choose to seek medical care at your doctor's office, retail clinic, urgent care center, or emergency room.  If you have a medical emergency, please immediately call 911 or go to the emergency department.  Pager Numbers  - Dr. Hester: [phone]  - Dr. Jackquline:  [phone]  - Dr. Claudene: [phone]   - Dr. Raymund: [phone]  In the event of inclement weather, please call our main line at [phone] for an update on the status of any delays or closures.  Dermatology Medication Tips: Please keep the boxes that topical medications come in in order to help keep track of the instructions about where and how to use these. Pharmacies typically print the medication instructions only on the boxes and not directly on the medication tubes.   If your medication is too expensive, please contact our office at [phone] option 4 or send us  a message through MyChart.   We are unable to tell what your co-pay for medications will be in advance as this is different depending on your insurance coverage. However, we may be able to find a substitute medication at lower cost or fill out paperwork to get insurance to cover  a needed medication.   If a prior authorization is required to get your medication covered by your insurance company, please allow us  1-2 business days to complete this process.  Drug prices often vary depending on where the prescription is filled and some pharmacies may offer cheaper prices.  The website www.goodrx.com contains coupons for medications through different pharmacies. The prices here do not account for what the cost may be with help from insurance (it may be cheaper with your insurance), but the website can give you the price if you did not use any insurance.  - You can print the associated coupon and take it with your prescription to the pharmacy.  - You may also stop by our office during regular business hours and pick up a GoodRx coupon card.  - If you need your prescription sent electronically to a different pharmacy, notify our office through [facility] or by phone at [phone] option 4.     Si Usted Necesita Algo Despus de Su Visita  Tambin puede enviarnos un mensaje a travs de Clinical cytogeneticist. Por lo general  respondemos a los mensajes de MyChart en el transcurso de 1 a 2 das hbiles.  Para renovar recetas, por favor pida a su farmacia que se ponga en contacto con nuestra oficina. Randi lakes de fax es [place] [phone].  Si tiene un asunto urgente cuando la clnica est cerrada y que no puede esperar hasta el siguiente da hbil, puede llamar/localizar a su doctor(a) al nmero que aparece a continuacin.   Por favor, tenga en cuenta que aunque hacemos todo lo posible para estar disponibles para asuntos urgentes fuera del horario de [place], no estamos disponibles las 24 horas del da, los 7 [address]  [address] de la [place].   Si tiene un problema urgente y no puede comunicarse con nosotros, puede optar por buscar atencin mdica  en el consultorio de su doctor(a), en una clnica privada, en un centro de atencin urgente o en una sala de emergencias.  Si tiene Engineer, drilling, por favor llame inmediatamente al 911 o vaya a la sala de emergencias.  Nmeros de bper  - Dr. Hester: [phone]  - Dra. Jackquline: [phone]  - Dr. Claudene: [phone]  - Dra. Kitts: [phone]  En caso de inclemencias del [place], por favor llame a nuestra lnea principal al [phone] para una actualizacin sobre el estado de cualquier retraso o cierre.  Consejos para la medicacin en dermatologa: Por favor, guarde las cajas en las que vienen los medicamentos de uso tpico para ayudarle a seguir las instrucciones sobre dnde y cmo usarlos. Las farmacias generalmente imprimen las instrucciones del medicamento slo en las cajas y no directamente en los tubos del [place].   Si su medicamento es muy caro, por favor, pngase en contacto con landry rieger llamando al [phone] y presione la opcin 4 o envenos un mensaje a travs de Clinical cytogeneticist.   No podemos decirle cul ser su copago por los medicamentos por adelantado ya que esto es diferente dependiendo de la cobertura de su seguro. Sin embargo, es posible  que podamos encontrar un medicamento sustituto a Audiological scientist un formulario para que el seguro cubra el medicamento que se considera necesario.   Si se requiere una autorizacin previa para que su compaa de seguros malta su medicamento, por favor permtanos de 1 a 2 das hbiles para completar este proceso.  Los precios de los medicamentos varan con frecuencia dependiendo del Environmental consultant de dnde se surte la receta y ir[place]  pueden ofrecer precios ms baratos.  El sitio web www.goodrx.com tiene cupones para medicamentos de Health and safety inspector. Los precios aqu no tienen en cuenta lo que podra costar con la ayuda del seguro (puede ser ms barato con su seguro), pero el sitio web puede darle el precio si no utiliz Tourist information centre manager.  - Puede imprimir el cupn correspondiente y llevarlo con su receta a la farmacia.  - Tambin puede pasar por nuestra oficina durante el horario de atencin regular y Education officer, museum una tarjeta de cupones de GoodRx.  - Si necesita que su receta se enve electrnicamente a una farmacia diferente, informe a nuestra oficina a travs de MyChart de [place] o por telfono llamando al [phone] y presione la opcin 4.

## 2024-02-11 ENCOUNTER — Encounter: Payer: Self-pay | Admitting: Dermatology

## 2024-07-14 ENCOUNTER — Ambulatory Visit: Admitting: Dermatology

## 2025-02-09 ENCOUNTER — Ambulatory Visit: Admitting: Dermatology
